# Patient Record
Sex: Male | Born: 1950 | Race: White | Hispanic: No
Health system: Midwestern US, Community
[De-identification: ages and names within clinical notes are randomized; demographics above are authoritative.]

---

## 2018-04-04 IMAGING — CR XR CHEST 2 VIEWS
1 series · 3 of 3 positions shown · non-contrast
Comparison: none

cough
TWO VIEW CHEST:
CLINICAL INDICATION: Cough
REFERENCE: 06/08/2016

[Series 6288: PA · 0.19mm/px · 3 of 3 slices shown]
[im 1/3]
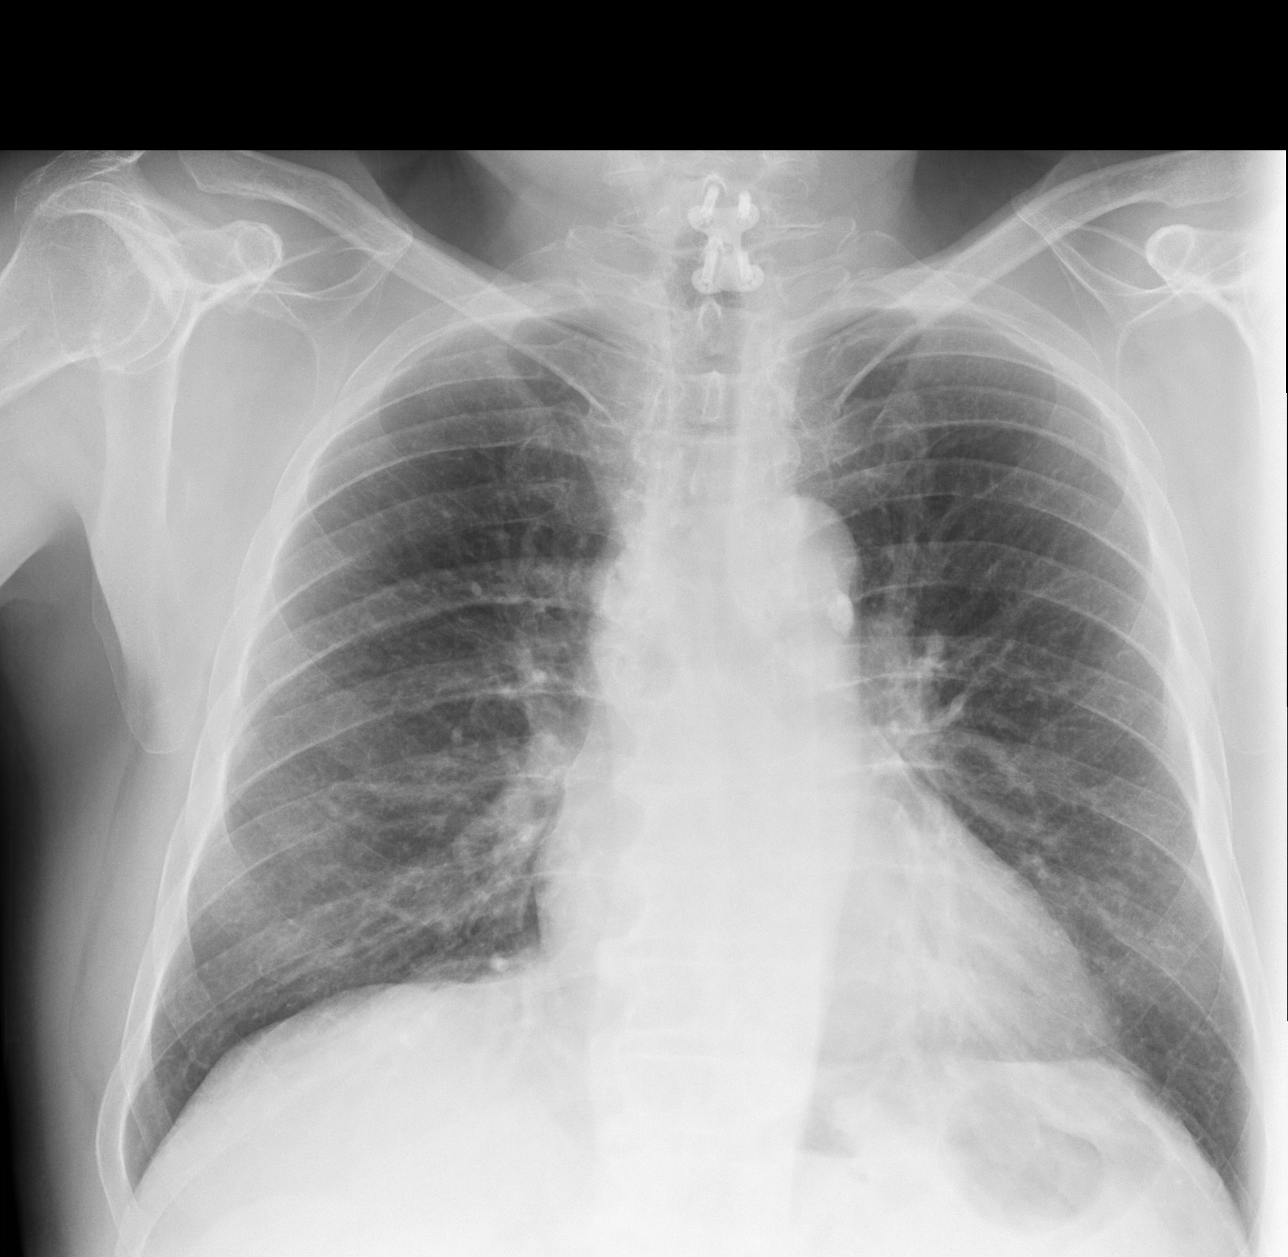
[im 2/3]
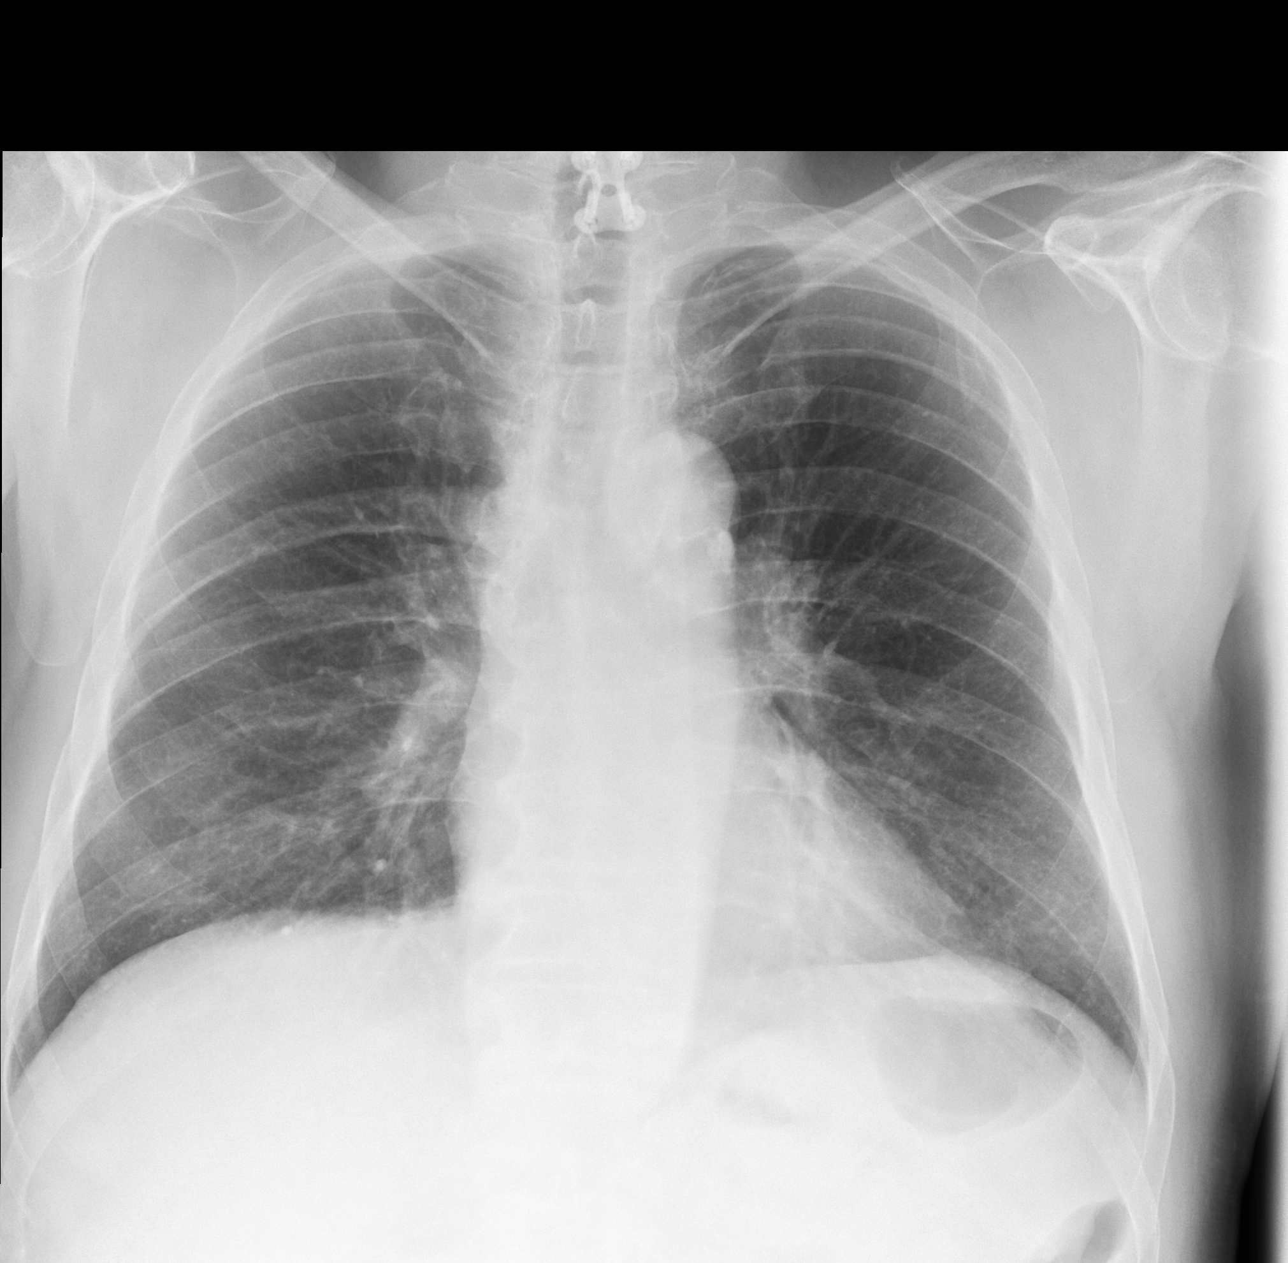
[im 3/3]
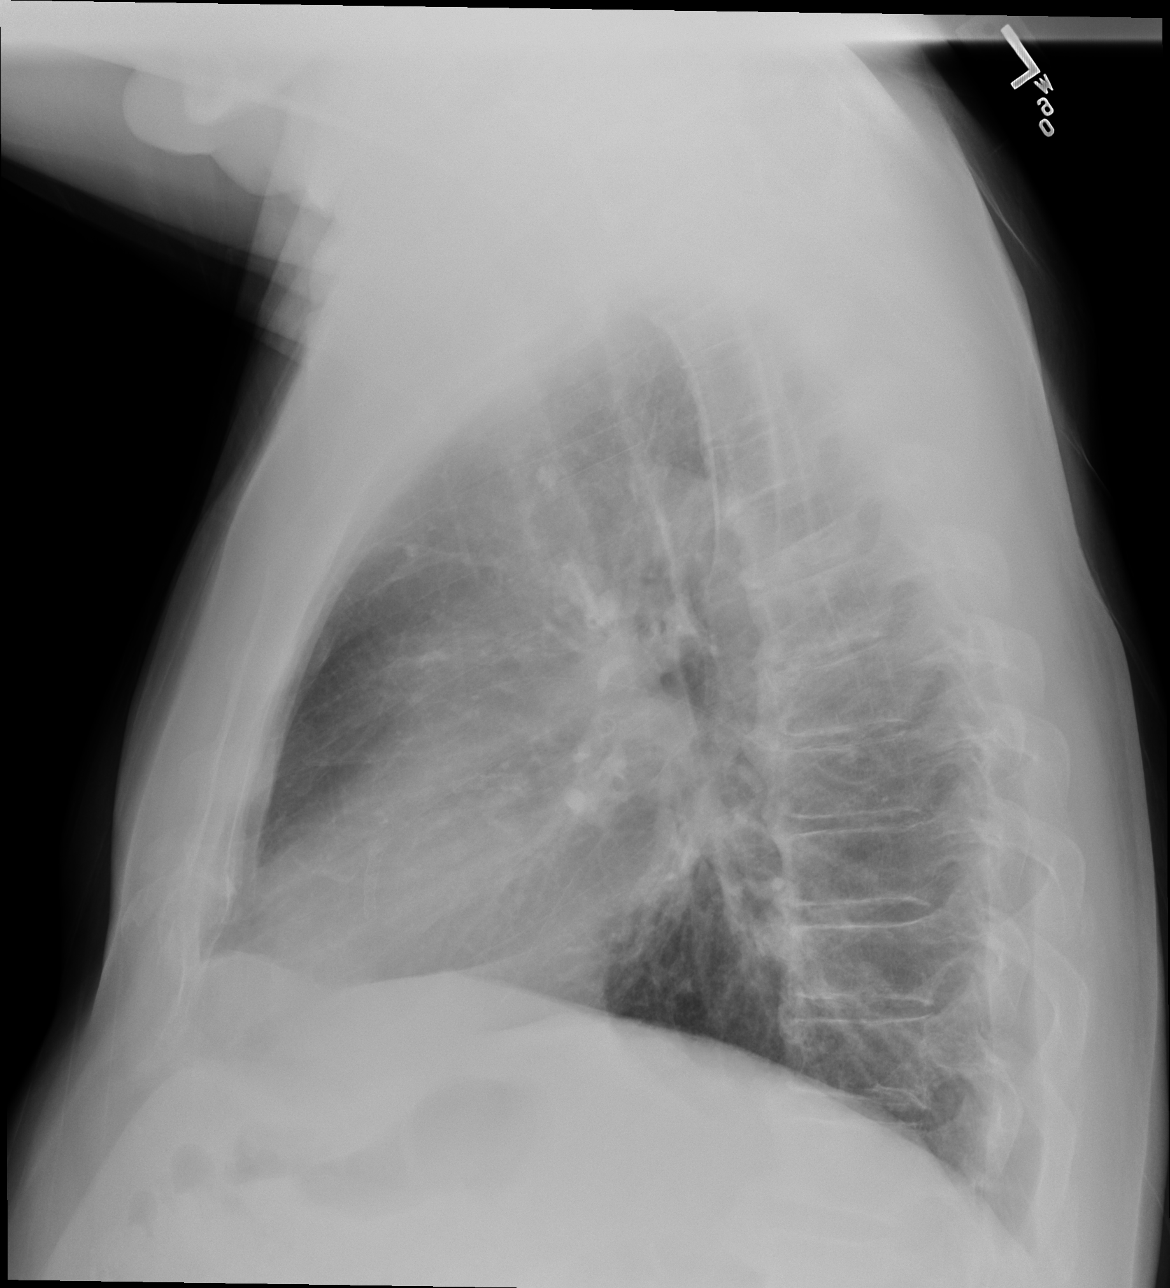

[3 of 3 positions shown; findings below may reference images not displayed]

FINDINGS: PA and lateral radiographs of the chest demonstrate:
Normal cardiomediastinal contours.
Heart size appears normal.
The lungs are normally inflated and demonstrate no mass, infiltrate, or effusion.
Surrounding osseous and soft tissue structures are unchanged.
IMPRESSION: No acute cardiopulmonary process identified.
LOCATION CODE: 1

## 2019-01-31 IMAGING — CT CT ABDOMEN PELVIS W CONTRAST
3 of 7 series · 12 of 46 positions shown, 19 images · IV contrast (CONTRAST)
Comparison: none

CT abdomen pelvis with contrast
INDICATION: Fall with back pain
TECHNIQUE: Axial coronal and sagittal images were performed

[Series 2: abdomen with · axial · 0.87mm/px · z∈[-400,-20]mm · 8 of 165 slices shown, 13 images]
[im 19/165  soft-tissue]
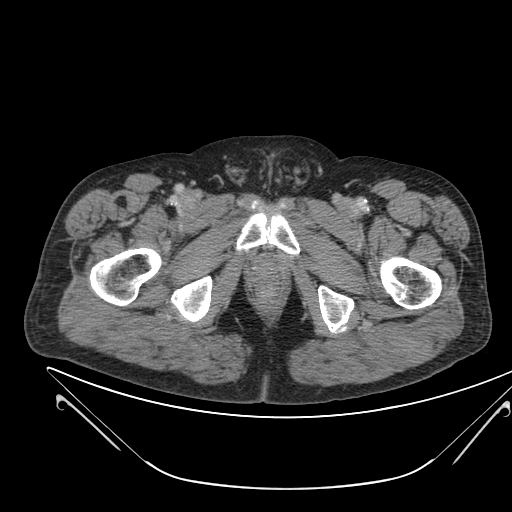
[im 19/165  bone]
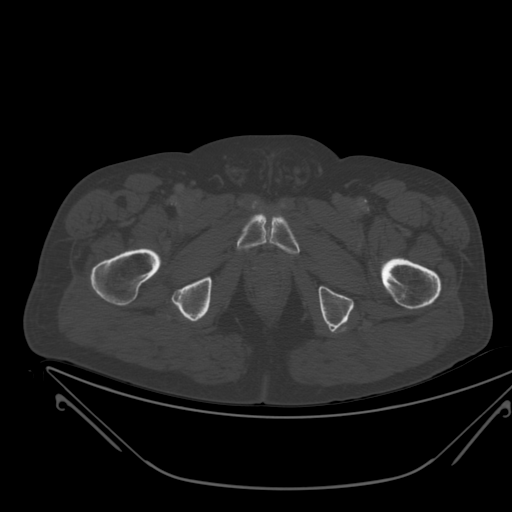
[im 37/165  soft-tissue]
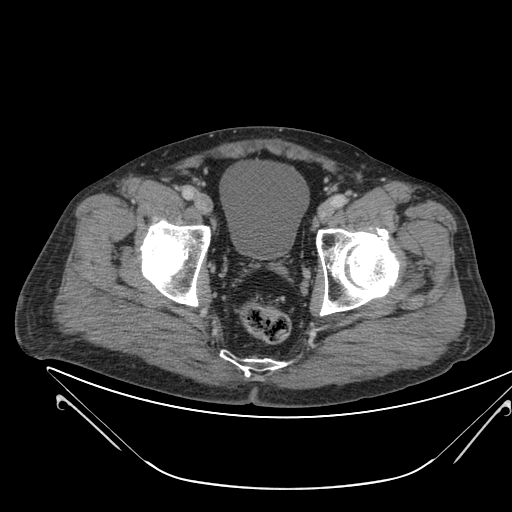
[im 55/165  soft-tissue]
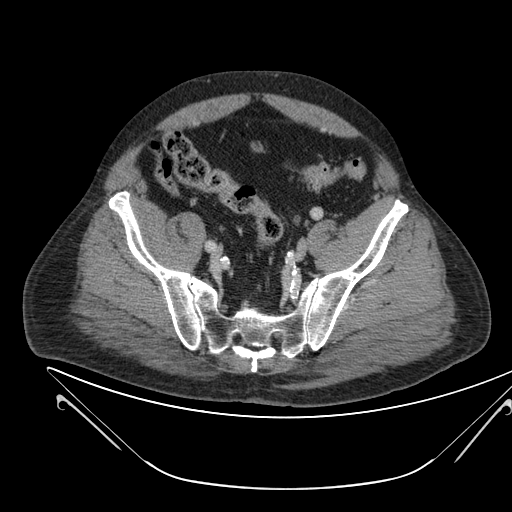
[im 73/165  soft-tissue]
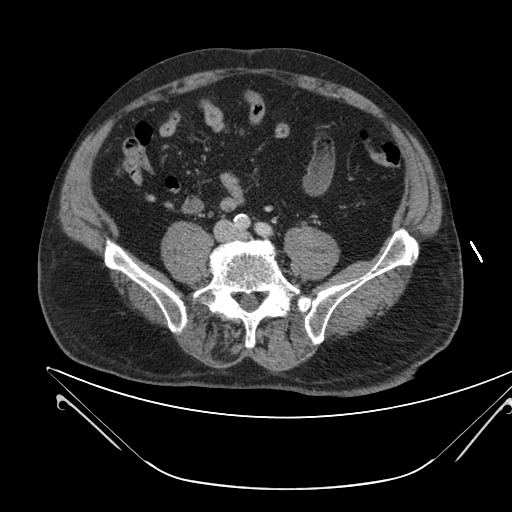
[im 92/165  soft-tissue]
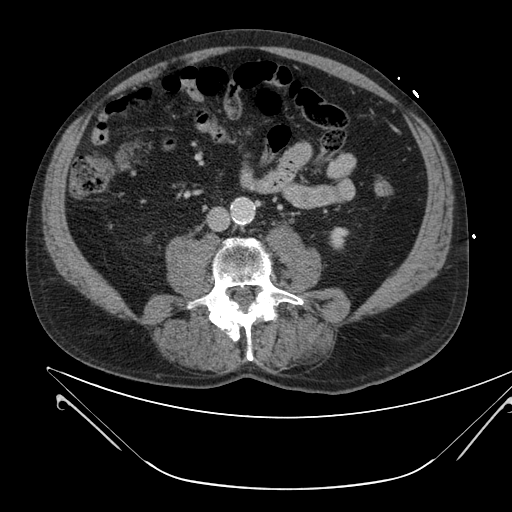
[im 92/165  lung]
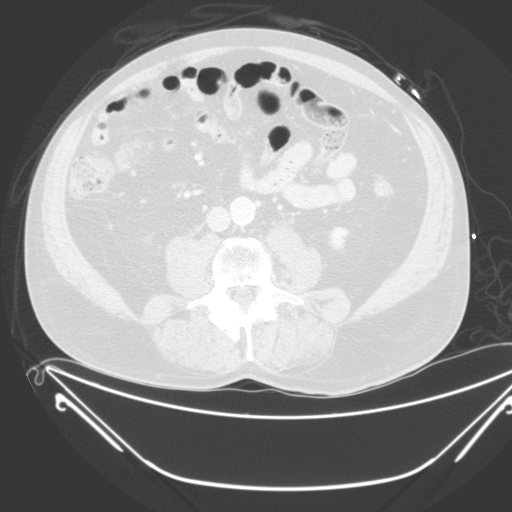
[im 110/165  soft-tissue]
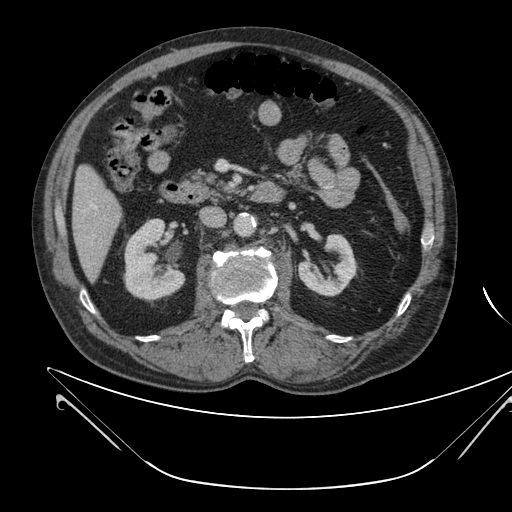
[im 110/165  lung]
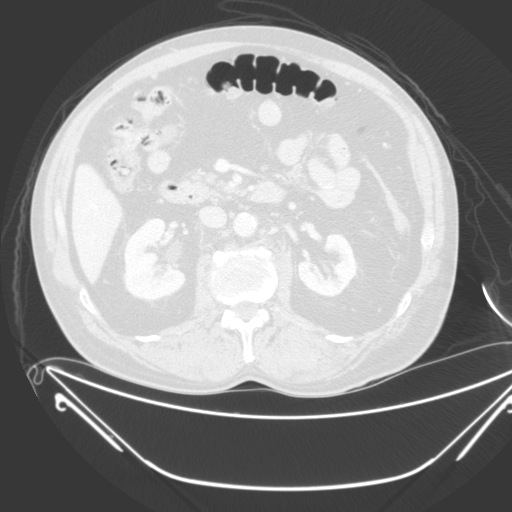
[im 128/165  soft-tissue]
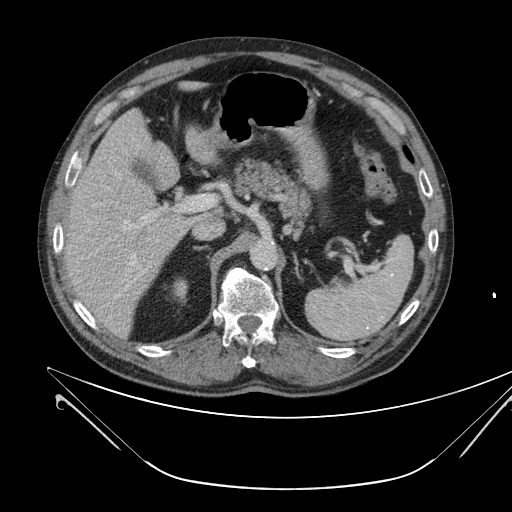
[im 128/165  lung]
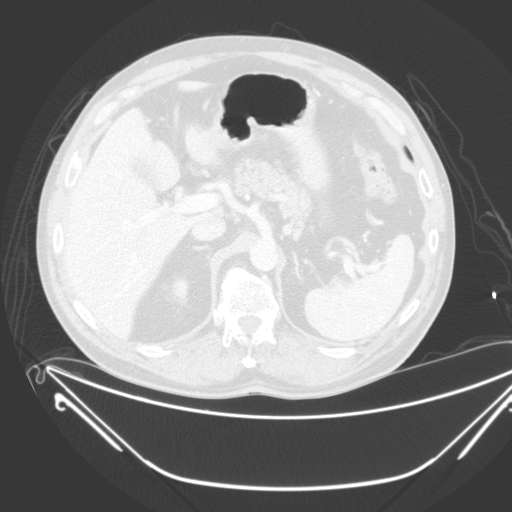
[im 146/165  soft-tissue]
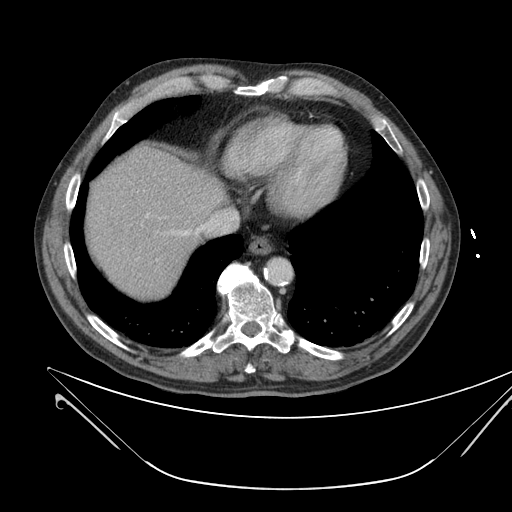
[im 146/165  lung]
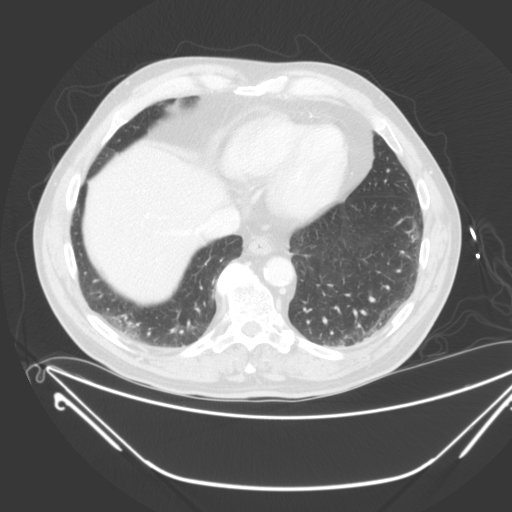

[mpr, coronal, coronal · coronal · 0.96mm/px · 3 of 157 slices shown, 4 images]
[im 53/157  soft-tissue]
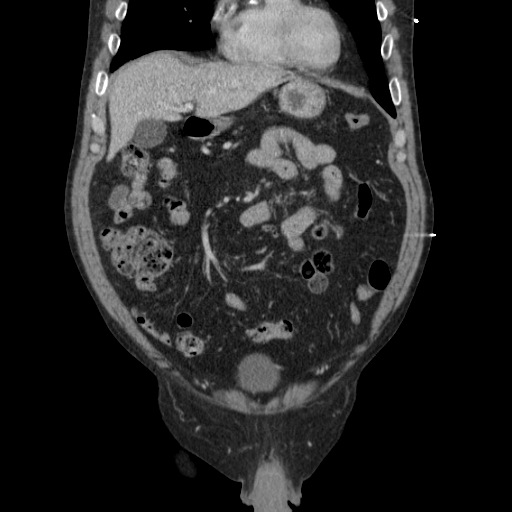
[im 79/157  soft-tissue]
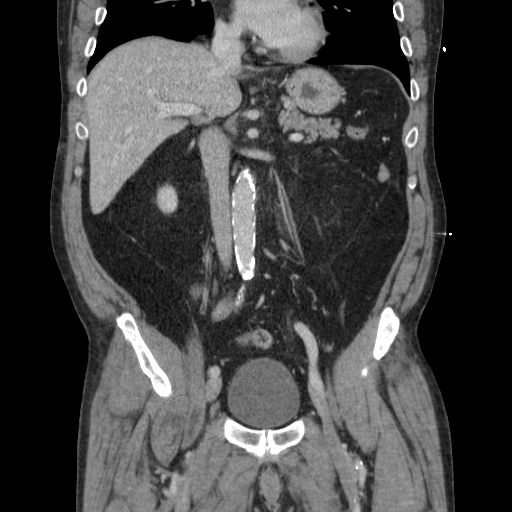
[im 79/157  bone]
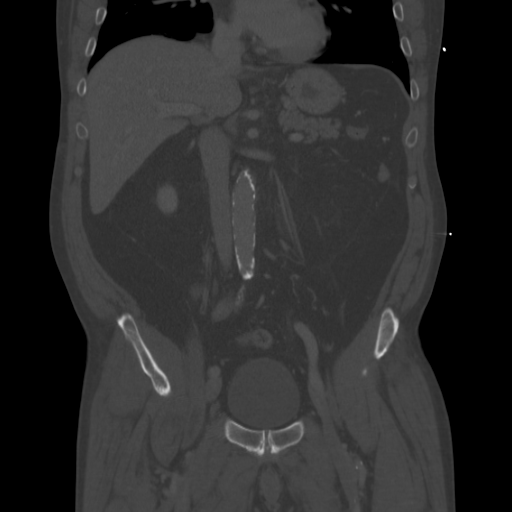
[im 105/157  soft-tissue]
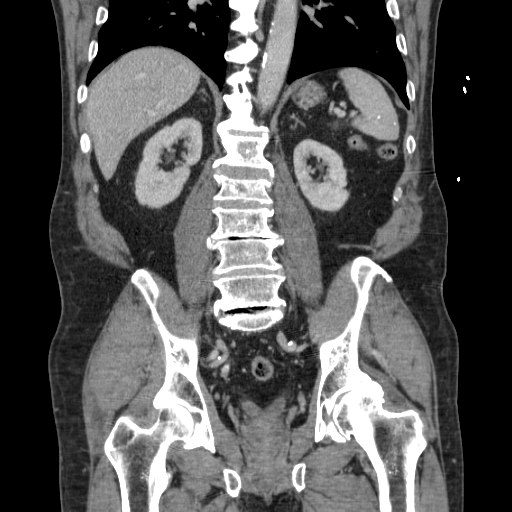

[mpr, sagittal, sagittal · sagittal · 0.96mm/px · 1 of 186 slices shown, 2 images]
[im 93/186  soft-tissue]
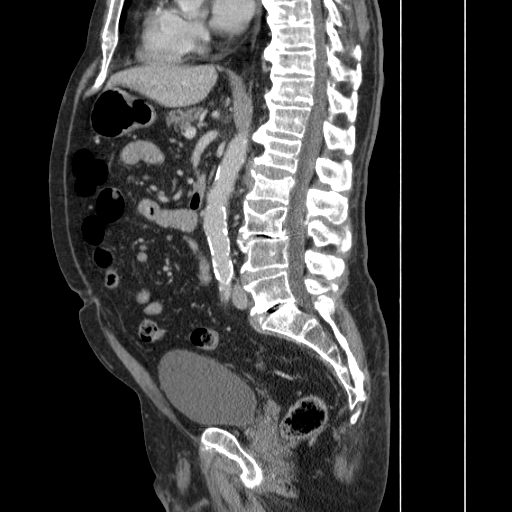
[im 93/186  bone]
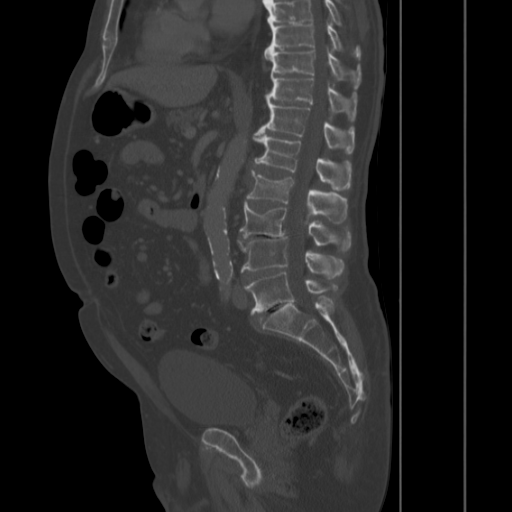

[12 of 46 positions shown; findings below may reference images not displayed]

FINDINGS: Mild atelectasis in the right lower lung. Mild fatty infiltration the liver. Adrenal glands, pancreas, gallbladder and spleen appear normal. Appendix appears normal. No free fluid is seen in the abdomen or pelvis.
There is acute fracture of the superior endplate of the L2 vertebral body. No severe retropulsion is seen surrounding inflammation anteriorly degenerative change in bilateral hips. Mild irregularities. Endplate of L3 vertebral body. Mild anterior listhesis of L3 on L4. Bilateral pars defects at L3.
IMPRESSION: 1. Acute compression fractures of the superior endplate and anterior vertebral body of the L2 vertebral body. Surrounding inflammation anteriorly.
2. Bilateral pars defects at L4 3. Mild anterior listhesis of L3 on L4. Mild irregularities. Endplate may be chronic.
3. Degenerative changes seen throughout the spine and bilateral hips.
4. No acute intra-abdominal findings otherwise seen.

## 2019-01-31 IMAGING — DX XR CHEST 1 VIEW
1 series · 1 of 1 positions shown · non-contrast
Comparison: none

Portable chest
INDICATION: Chest pain

[AP]
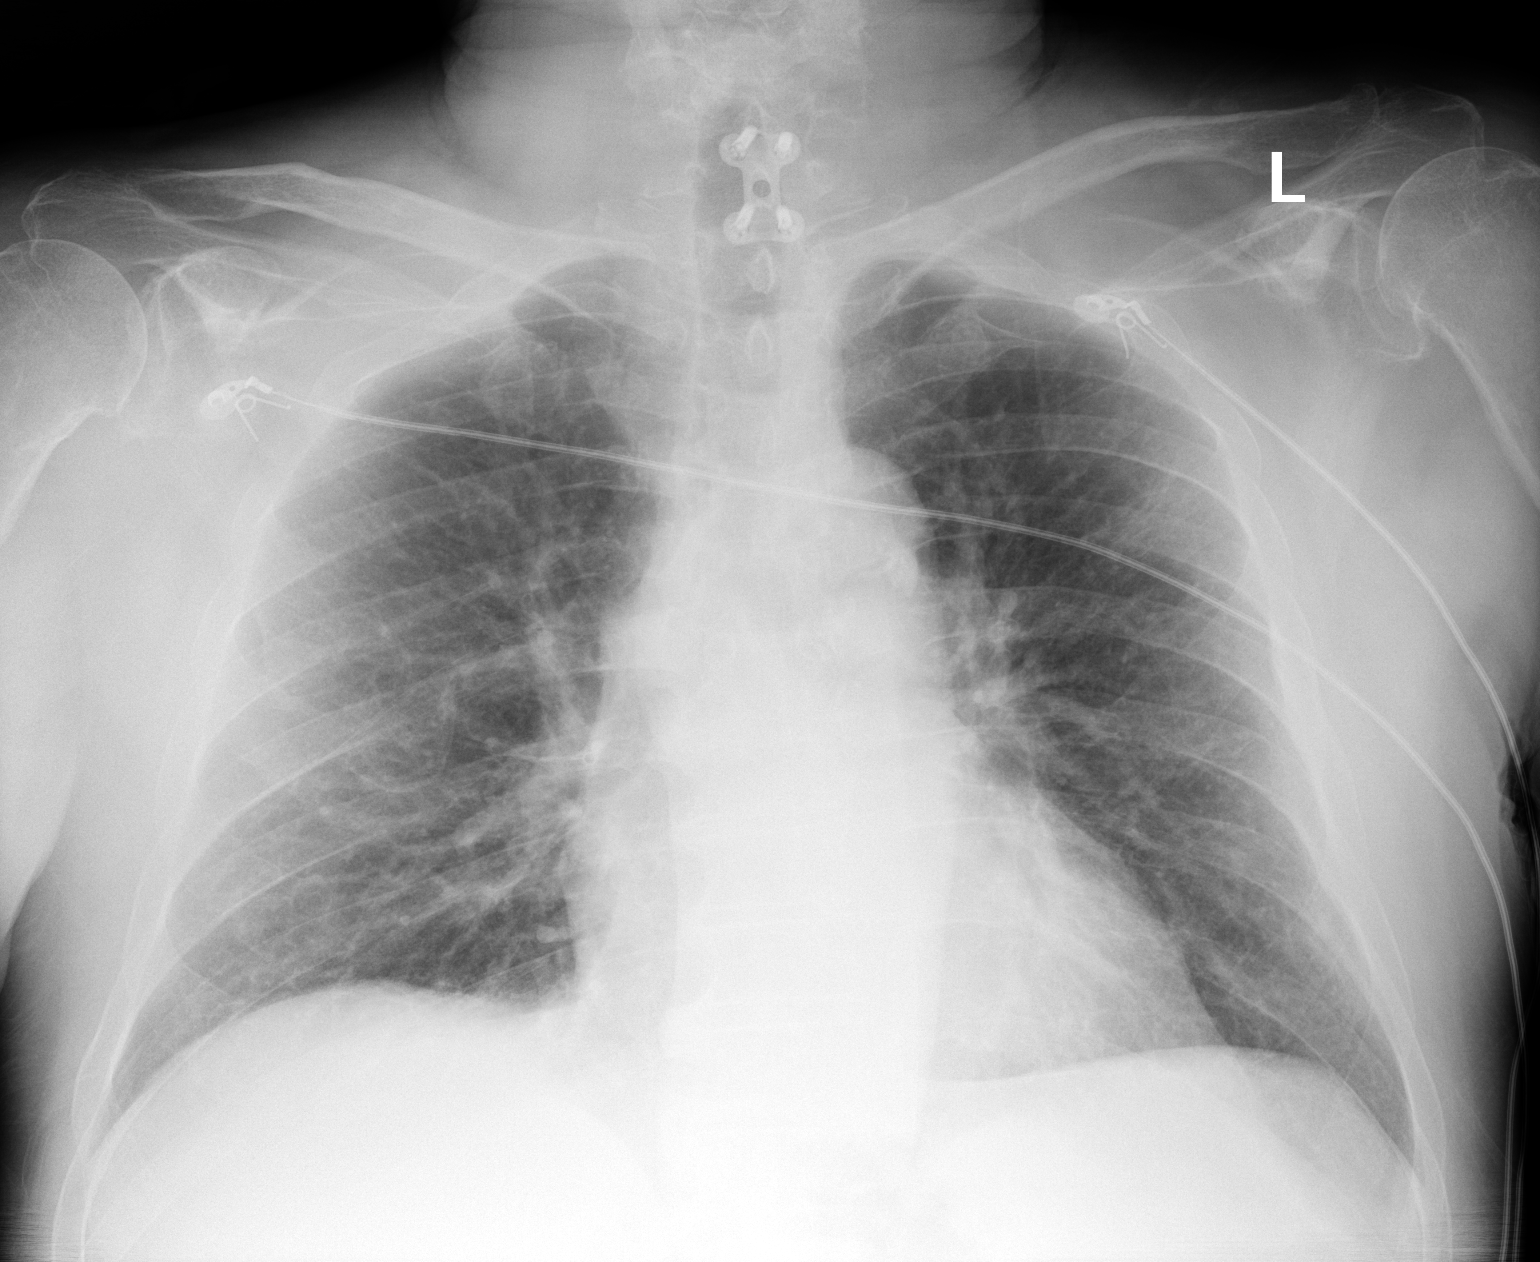

[1 of 1 positions shown; findings below may reference images not displayed]

FINDINGS: No focal consolidation, pleural effusion or pneumothorax. Heart size appears normal.
IMPRESSION: No acute findings.

## 2019-06-01 IMAGING — CR Chest
3 series · 3 of 3 positions shown · non-contrast
Comparison: none

TWO VIEW CHEST:
CLINICAL INDICATION: night sweats, h/o smoking. eval for lung lesions
REFERENCE: [id number].

[pa (1 of 2)]
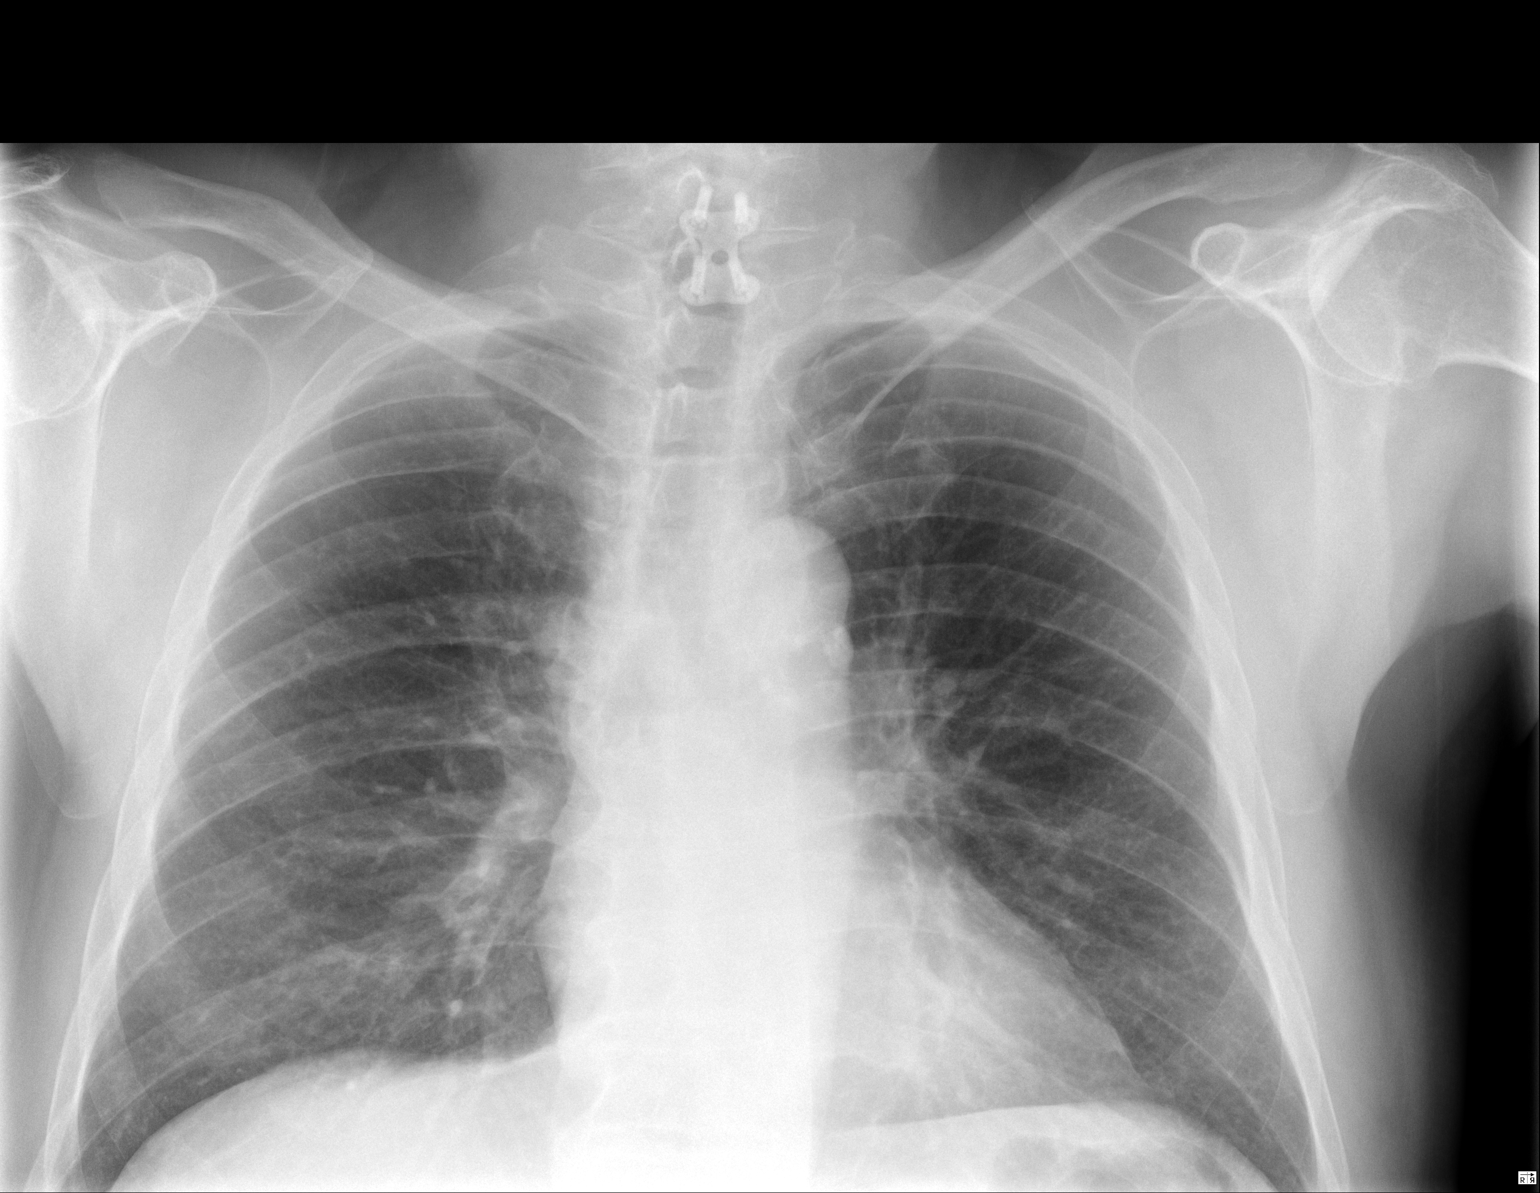

[pa (2 of 2)]
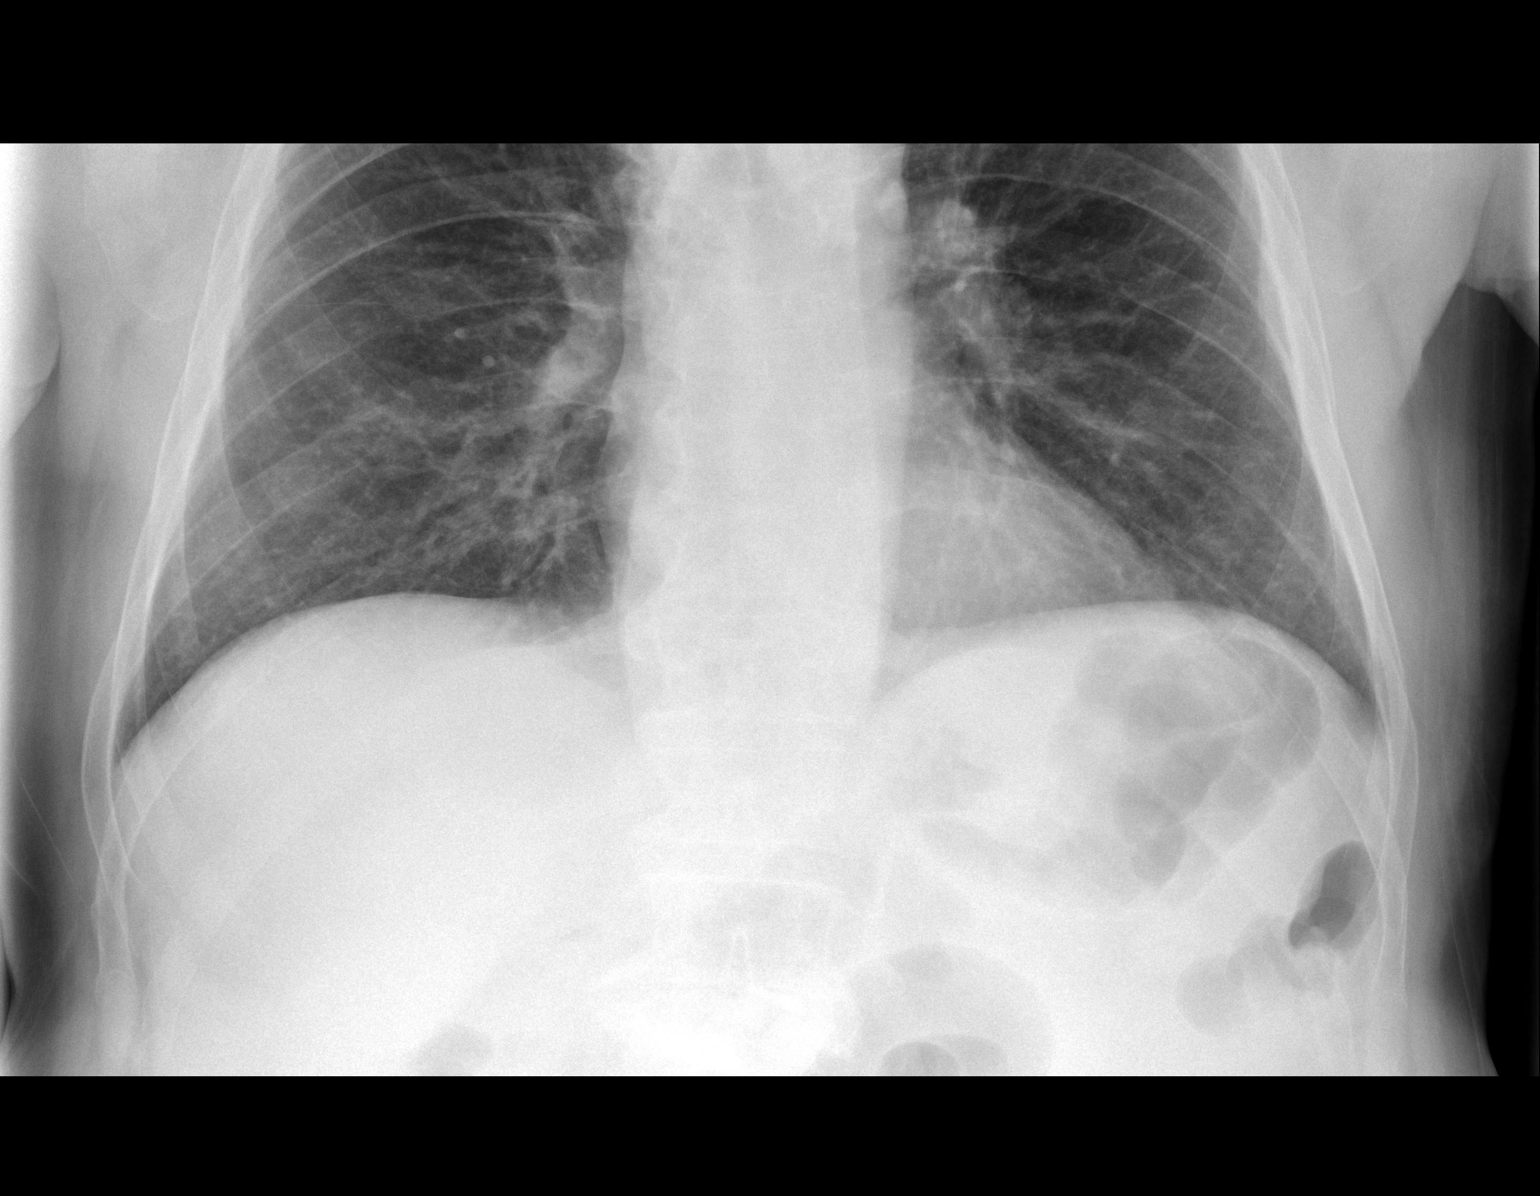

[lateral l]
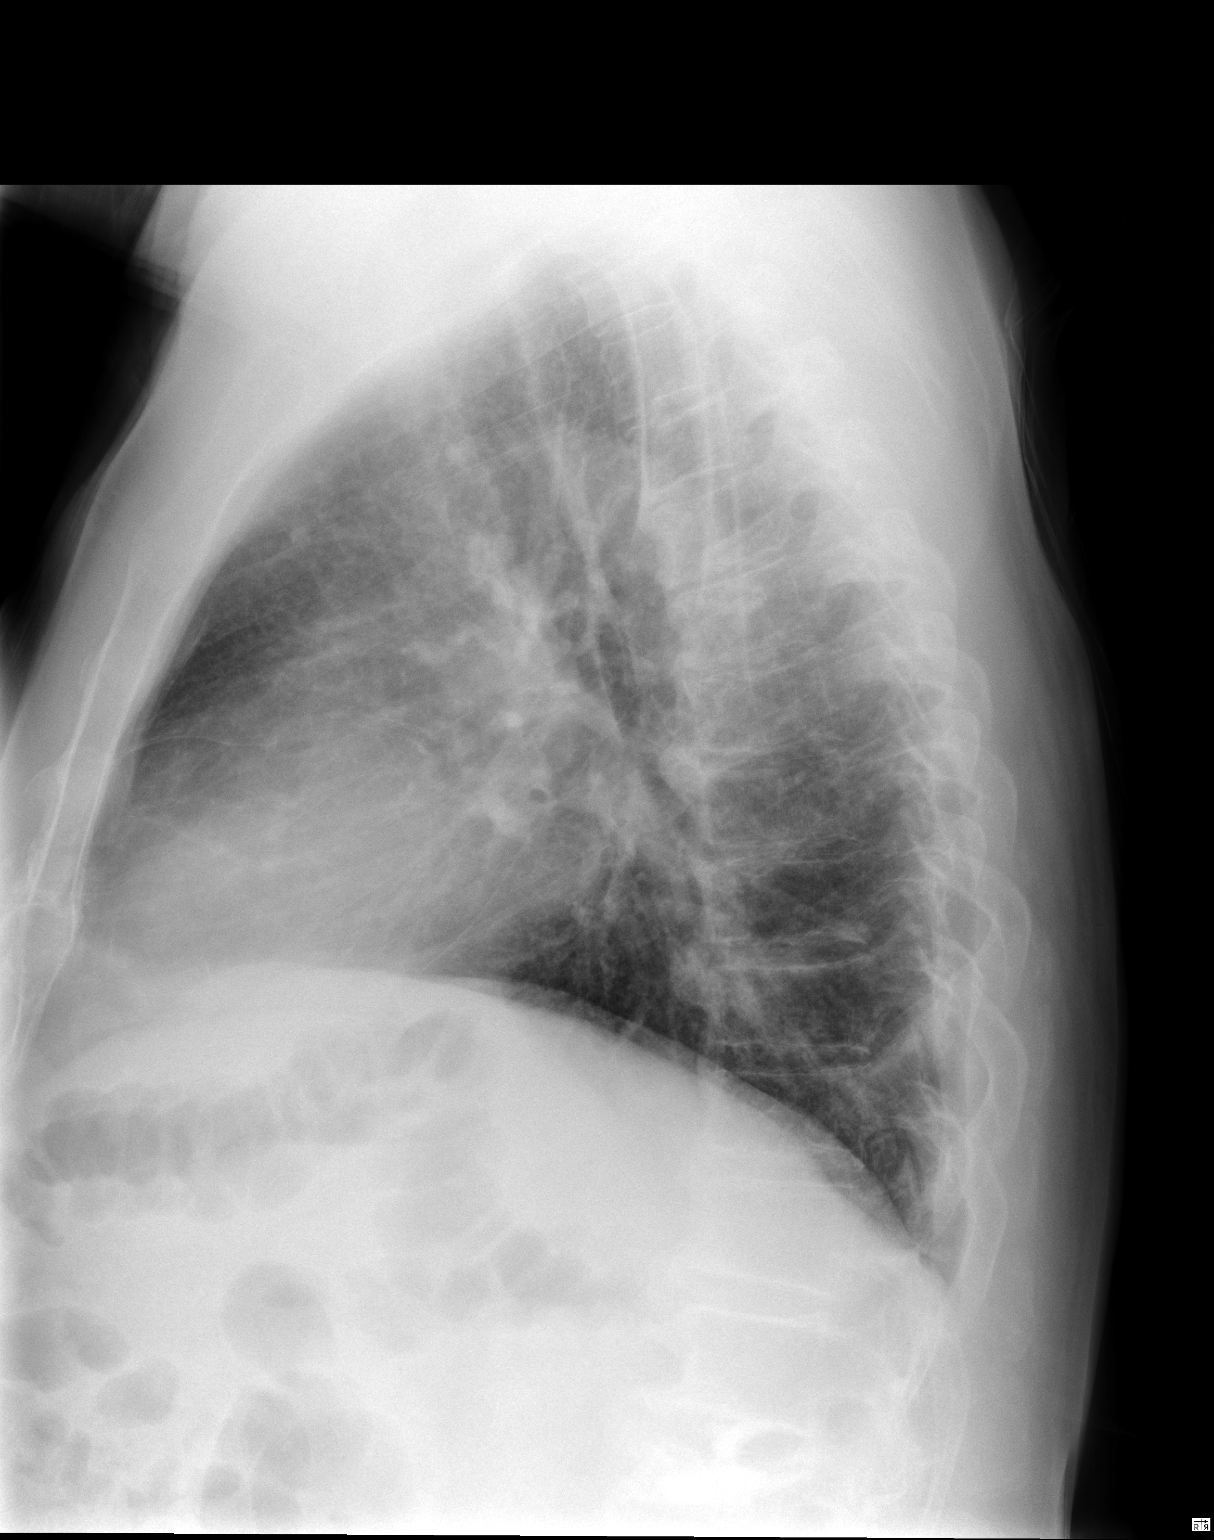

[3 of 3 positions shown; findings below may reference images not displayed]

FINDINGS: PA and lateral radiographs of the chest demonstrate:
Normal cardiomediastinal contours.
The lungs are normally inflated and demonstrate no mass, infiltrate, or effusion.
Surrounding osseous and soft tissue structures demonstrate no acute abnormality.
IMPRESSION: No acute cardiopulmonary process identified.
LOCATION CODE: 1

## 2020-02-19 IMAGING — US US UP EXT ARTERIES LEFT
1 series · 14 of 16 positions shown · non-contrast
Comparison: none

EXAM: LEFT UPPER EXTREMITY ARTERIAL DOPPLER
CLINICAL INDICATION: Essential (primary) hypertension
Pain in left arm
TECHNIQUE: Grayscale and color Doppler imaging with spectral analysis was utilized to evaluate the major arterial structures of the left upper extremity.

[Series 1: us up ext arteries left · 14 of 28 slices shown]
[im 1/28]
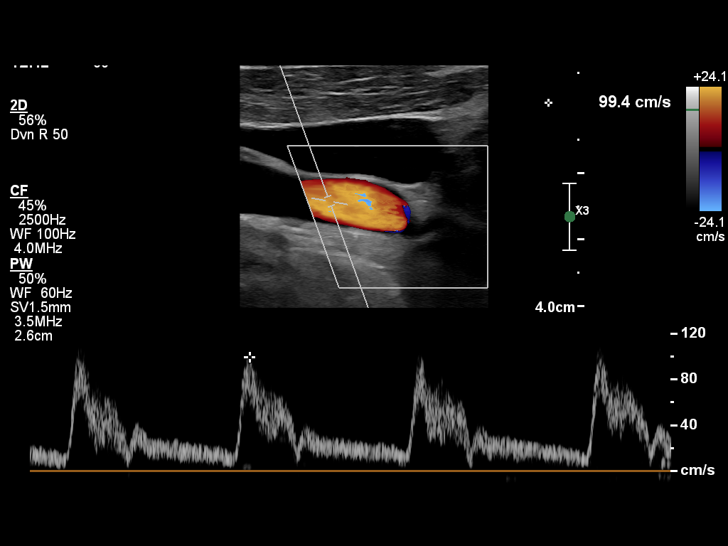
[im 2/28]
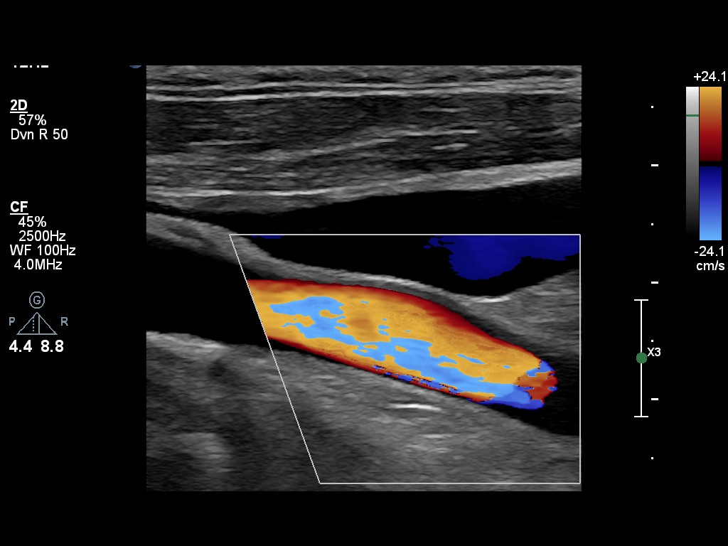
[im 4/28]
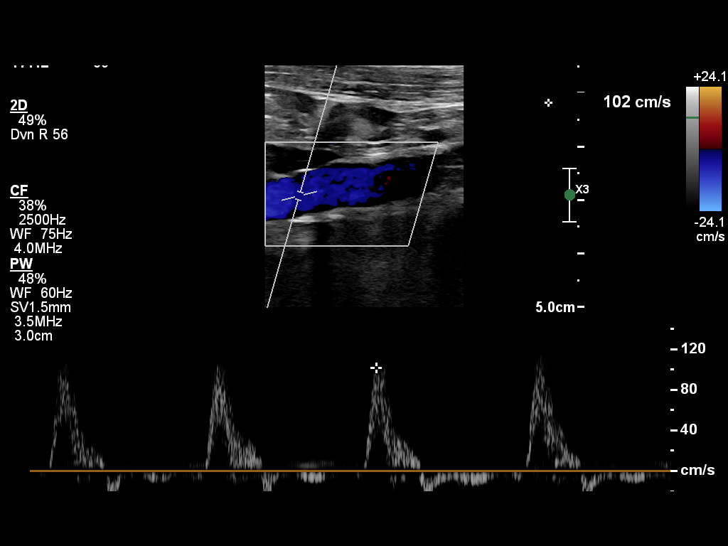
[im 8/28]
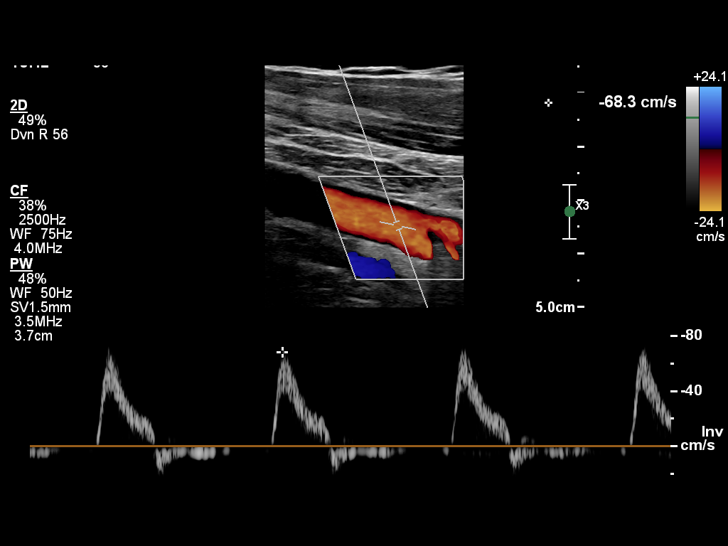
[im 10/28]
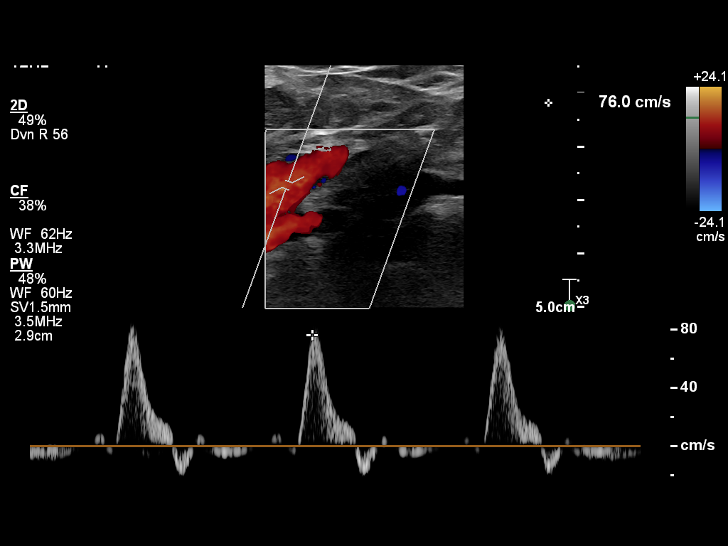
[im 11/28]
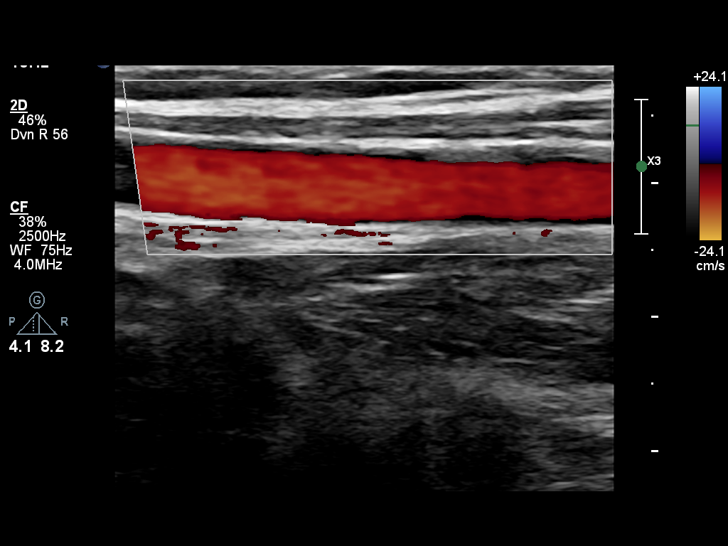
[im 13/28]
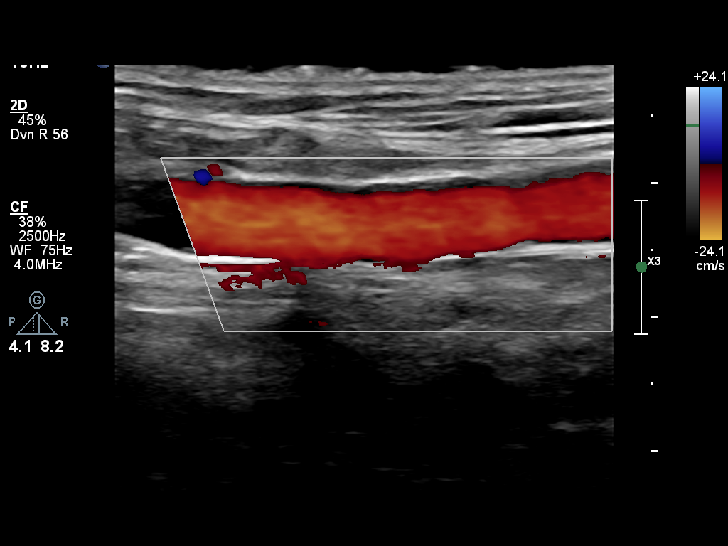
[im 15/28]
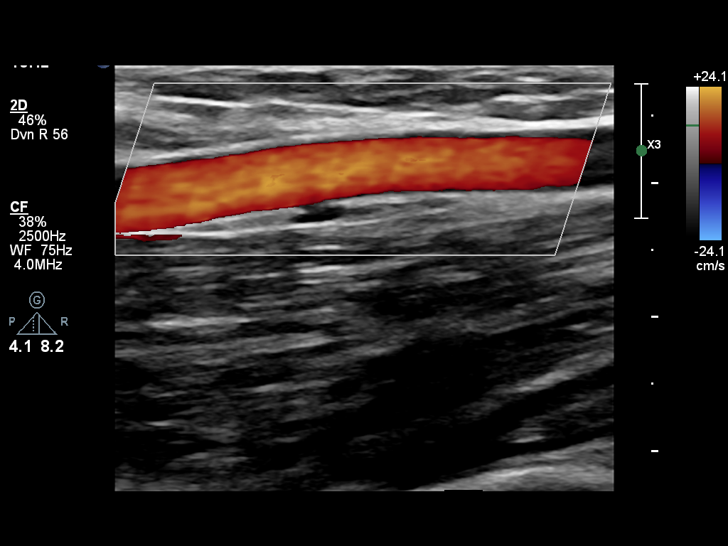
[im 17/28]
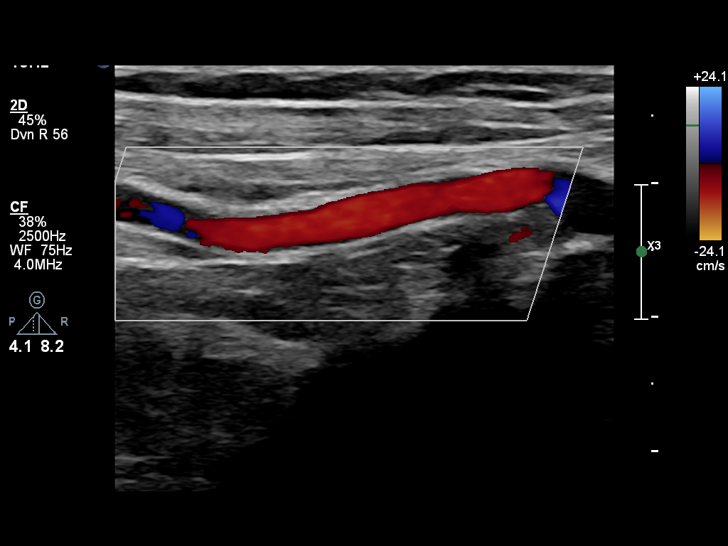
[im 19/28]
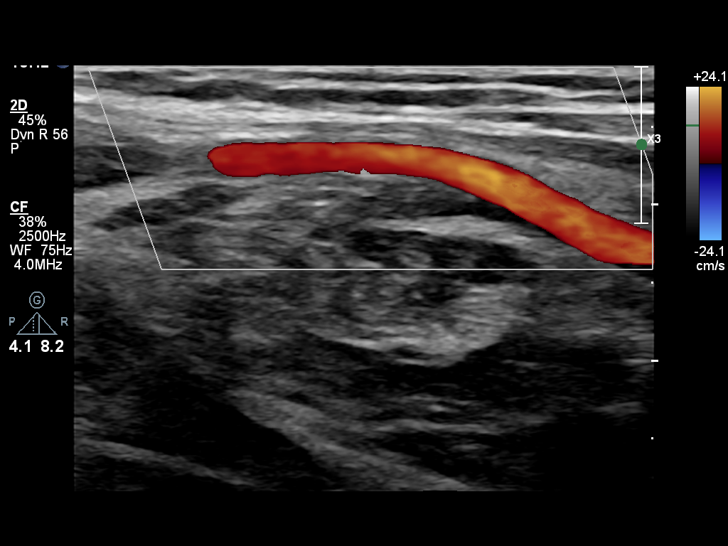
[im 22/28]
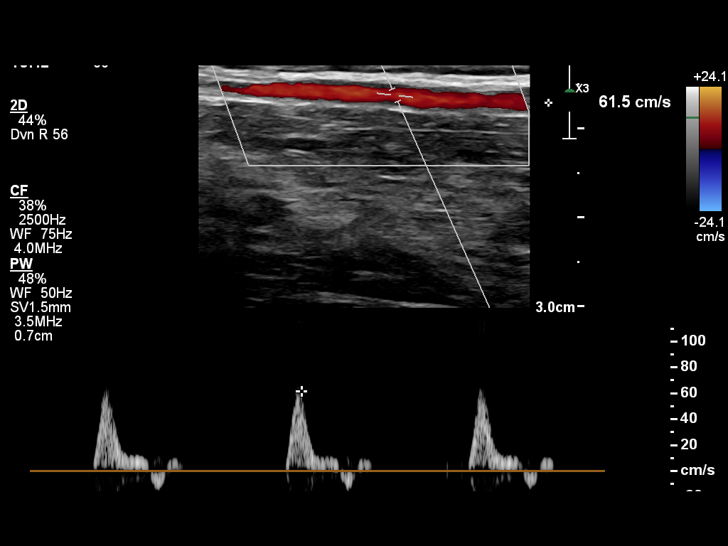
[im 24/28]
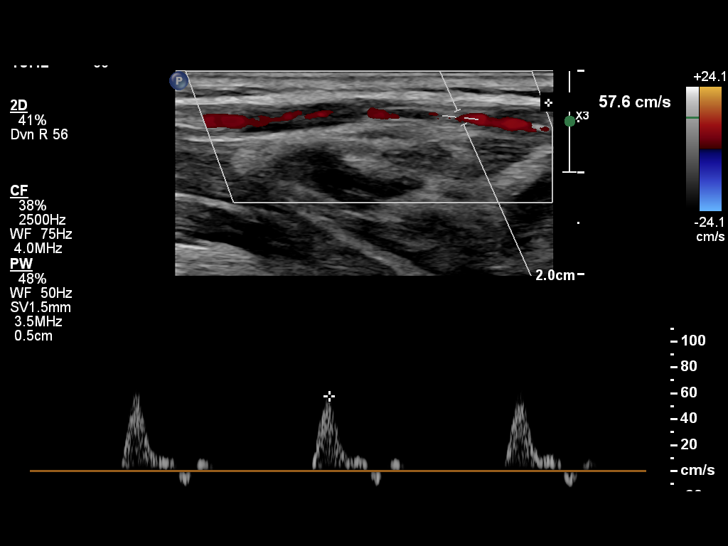
[im 26/28]
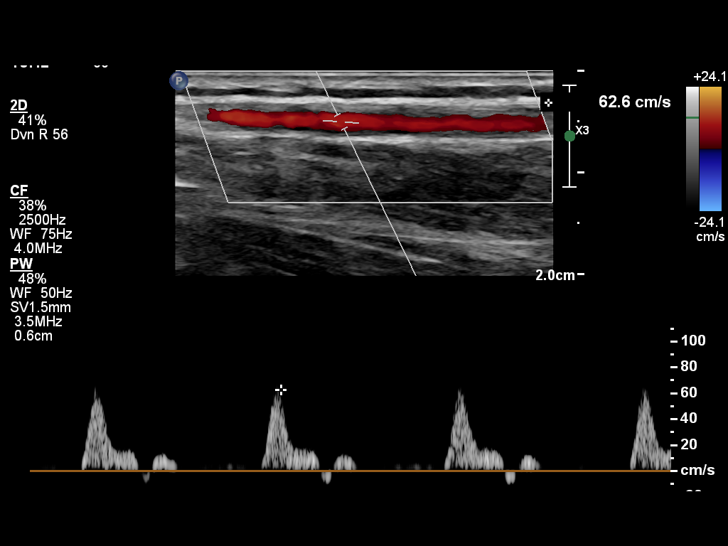
[im 28/28]
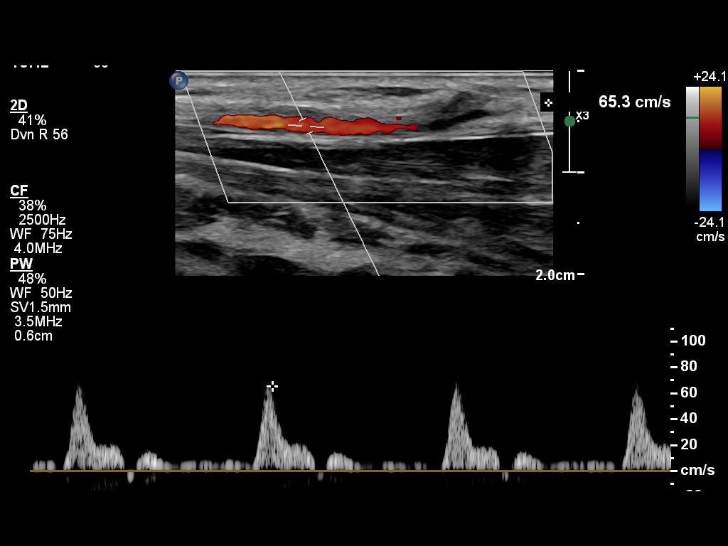

[14 of 16 positions shown; findings below may reference images not displayed]

FINDINGS: The common carotid artery, subclavian artery, axillary artery, brachial artery, radial artery, and ulnar arteries are patent and demonstrate no significant atherosclerotic change, flow limiting stenosis, or other abnormality. Waveforms are biphasic and triphasic throughout, normal.
FINDINGS: LEFT UPPER EXTREMITY:
PROXIMAL SUBCLAVIAN A:    102 cm/sec
MID SUBCLAVIAN A:    61 cm/sec
DISTAL SUBCLAVIAN A:    68 cm/sec
AXILLARY A:    76 cm/sec
PROXIMAL BRACHIAL A:    91 cm/sec
MID BRACHIAL A:    78 cm/sec
DISTAL BRACHIAL A:    68 cm/sec
PROXIMAL RADIAL A:    68 cm/sec
MID RADIAL A:    85 cm/sec
DISTAL RADIAL A:    62 cm/sec
PROXIMAL ULNAR A:    58 cm/sec
MID ULNAR A:    63 cm/sec
DISTAL ULNAR A:    65 cm/sec
IMPRESSION: No evidence of significant arterial stenosis or occlusion
LOCATION CODE : 1

## 2020-09-20 IMAGING — MR MRI ELBOW LEFT WITHOUT IV CONTRAST
8 series · 40 of 40 positions shown · non-contrast
Comparison: none

FINAL REPORT:
MRI ELBOW LEFT WITHOUT IV CONTRAST
INDICATION: Bicipital tendinitis, left shoulder
Bicipital tendinitis, left shoulder
TECHNIQUE: MRI left elbow without contrast. Multiplanar, multisequence imaging was performed and reviewed.

[Series 7: survey · axial · left · 6.0mm · 0.47mm/px · 1 of 9 slices shown (1 of 2)]
[im 1/9]
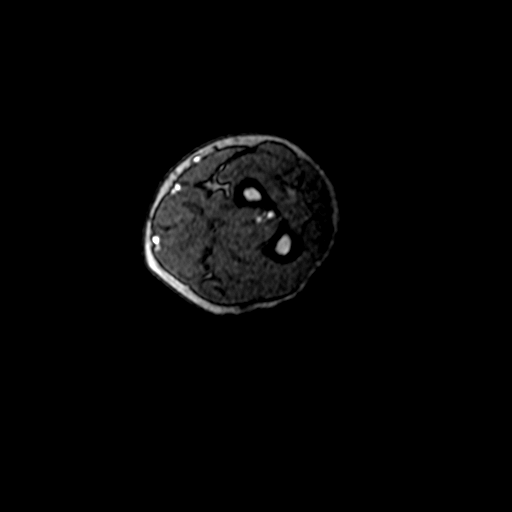

[Series 8: survey · axial · left · 6.0mm · 0.47mm/px · 1 of 9 slices shown (2 of 2)]
[im 1/9]
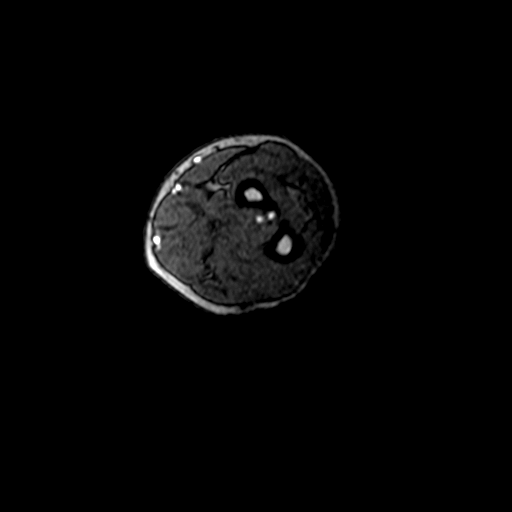

[Series 9: T1 · axial · left · 3.0mm · 0.50mm/px · z∈[-29,+82]mm · 5 of 32 slices shown (1 of 2)]
[im 1/32]
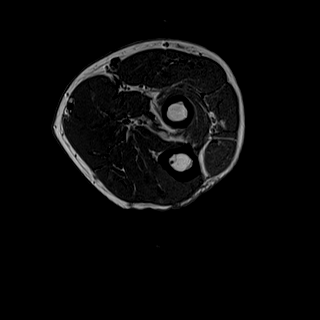
[im 8/32]
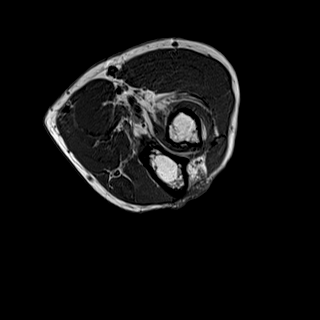
[im 16/32]
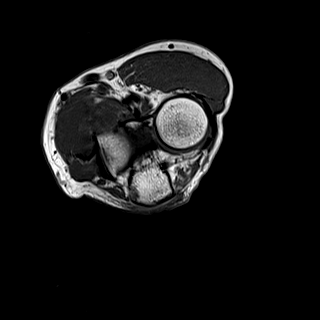
[im 24/32]
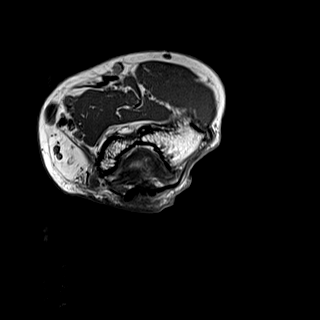
[im 32/32]
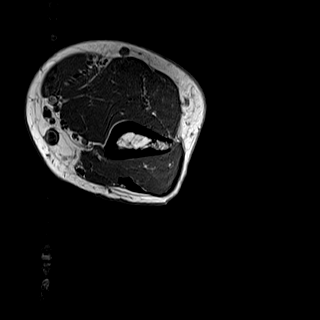

[Series 10: PD fat-sat · axial · left · 3.0mm · 0.50mm/px · z∈[-26,+85]mm · 5 of 32 slices shown (1 of 2)]
[im 1/32]
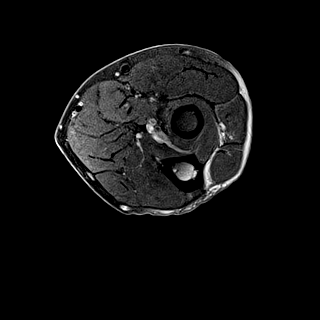
[im 8/32]
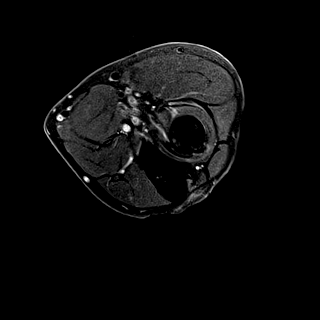
[im 16/32]
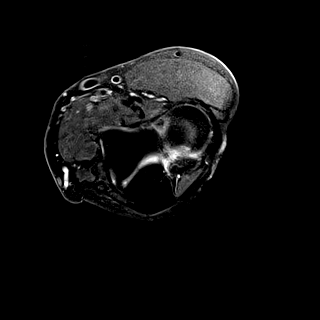
[im 24/32]
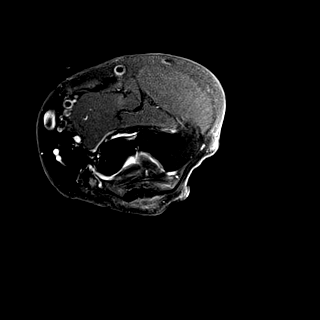
[im 32/32]
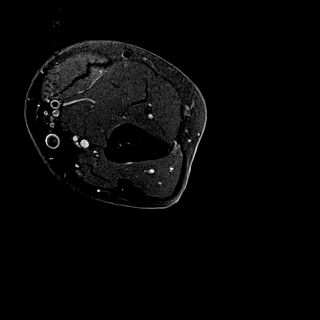

[Series 11: T1 · coronal · left · 3.0mm · 0.53mm/px · 5 of 30 slices shown (2 of 2)]
[im 1/30]
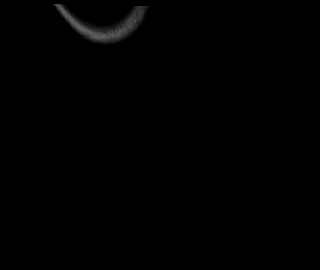
[im 8/30]
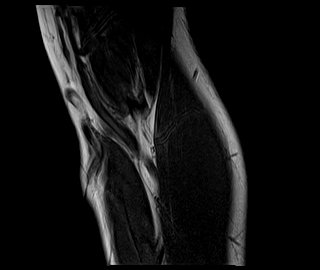
[im 15/30]
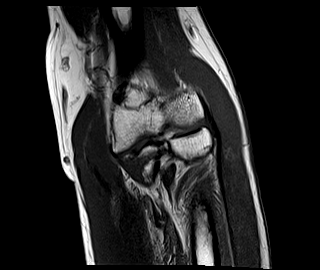
[im 22/30]
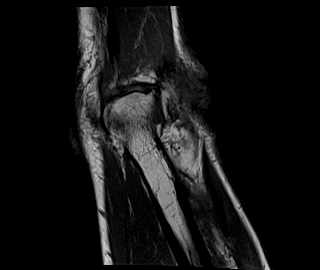
[im 30/30]
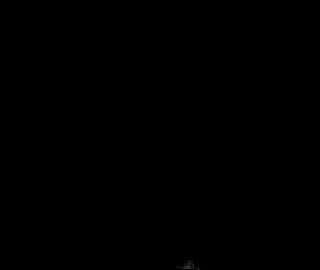

[Series 14: (person_name) · coronal · left · 1.0mm · 0.70mm/px · 13 of 80 slices shown]
[im 1/80]
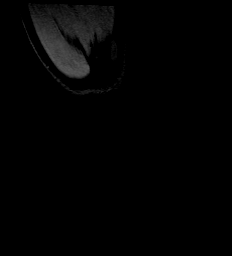
[im 7/80]
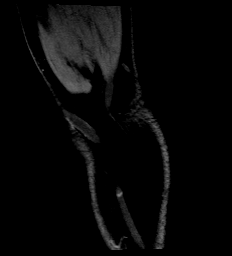
[im 14/80]
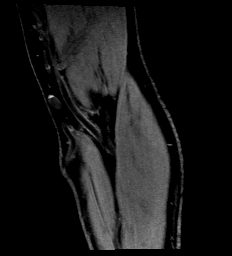
[im 20/80]
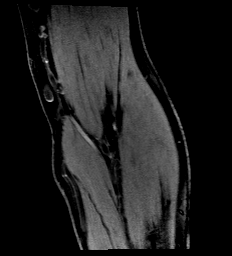
[im 27/80]
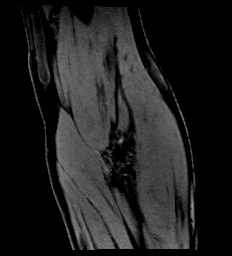
[im 33/80]
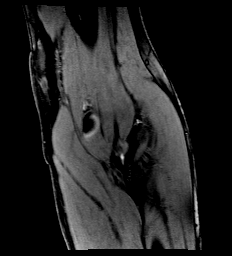
[im 40/80]
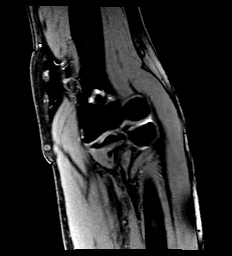
[im 47/80]
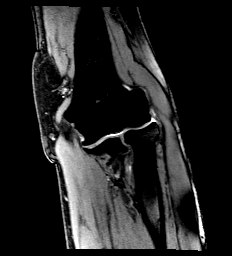
[im 53/80]
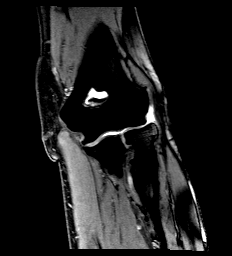
[im 60/80]
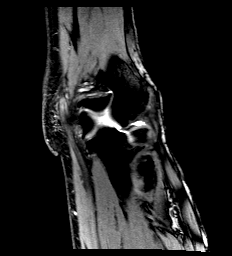
[im 66/80]
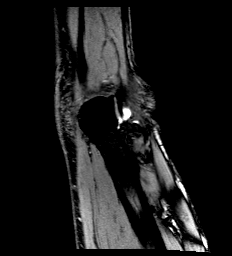
[im 73/80]
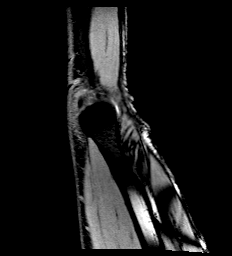
[im 80/80]
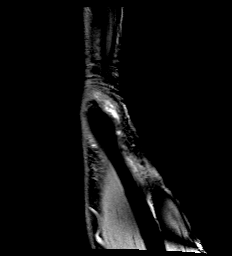

[Series 15: T2 fat-sat · coronal · left · 3.0mm · 0.56mm/px · 5 of 29 slices shown]
[im 1/29]
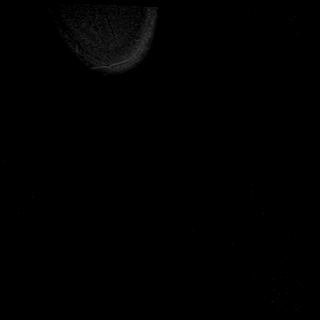
[im 8/29]
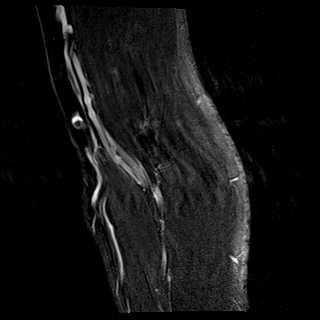
[im 15/29]
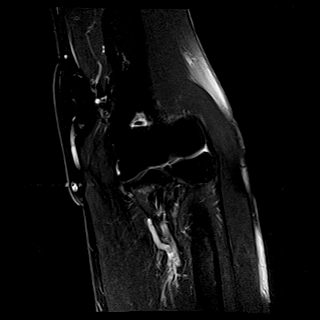
[im 22/29]
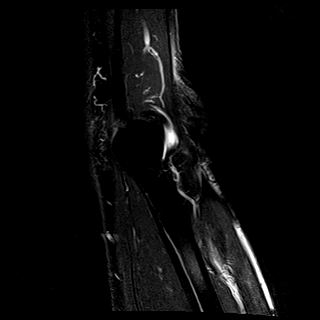
[im 29/29]
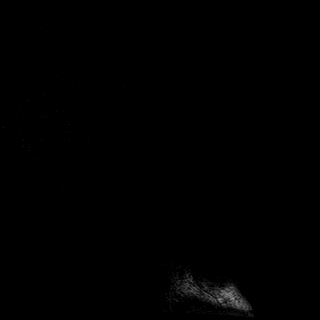

[Series 17: PD fat-sat · sagittal · left · 3.0mm · 0.50mm/px · 5 of 34 slices shown (2 of 2)]
[im 1/34]
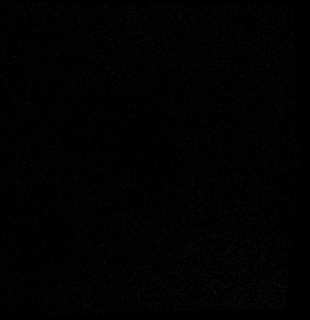
[im 9/34]
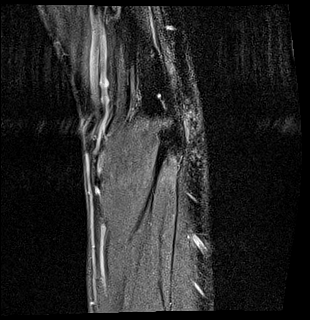
[im 17/34]
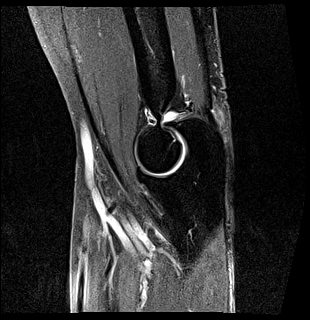
[im 25/34]
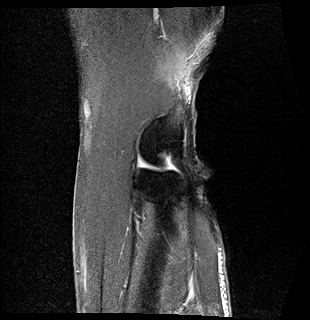
[im 34/34]
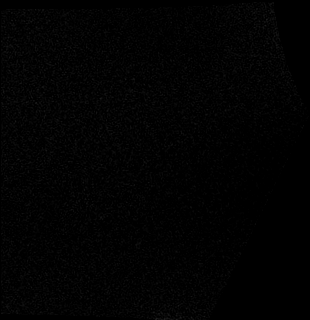

[40 of 40 positions shown; findings below may reference images not displayed]

FINDINGS: Osseous alignment is grossly anatomic. Mild to moderate cartilage thinning with no focal high-grade or full-thickness chondral defects. No joint effusion or evidence of joint spaces bodies. Mild marginal osteophyte formation. Collateral ligaments are grossly intact. The common flexor and extensor tendon origins are intact. The triceps tendon is intact and appears unremarkable. There is a mild increased signal and mild attenuation of the distal biceps tendon the level of the radial tuberosity. No evidence of high-grade or complete tendon tear.
IMPRESSION: 
IMPRESSION: 1. Biceps tendinosis/low-grade chronic tear. No evidence of high-grade or complete tendon rupture.
2. Degenerative changes as described.
Location 4

## 2021-06-13 IMAGING — MR MRI SHOULDER ARTHROGRAM LEFT
16 series · 40 of 40 positions shown · non-contrast
Comparison: None available

FINAL REPORT:
HISTORY: Shoulder pain
Procedure: MRI arthrogram of the left shoulder
TECHNIQUE: Multisequence, multiplanar imaging of the left shoulder was performed. Images acquired following direct arthrography as detailed in the separate report

[Series 1: survey · coronal · left · 1.6mm · 1.56mm/px · 9 of 120 slices shown (1 of 2)]
[im 1/120]
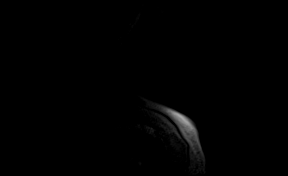
[im 15/120]
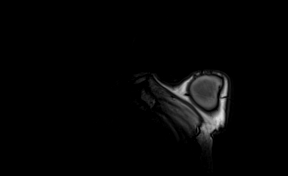
[im 30/120]
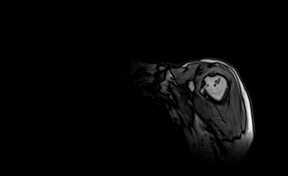
[im 45/120]
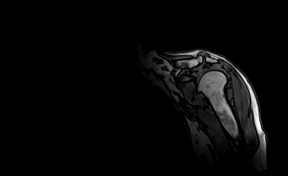
[im 60/120]
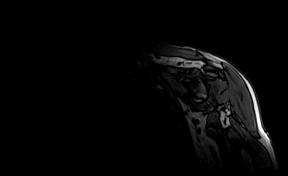
[im 75/120]
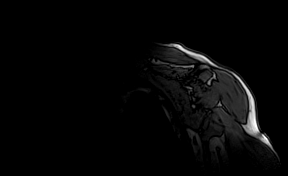
[im 90/120]
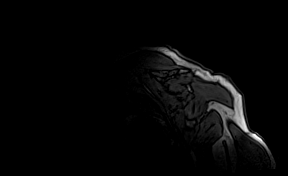
[im 105/120]
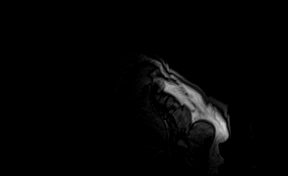
[im 120/120]
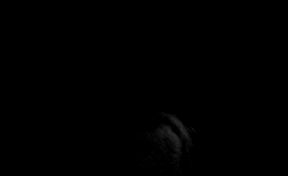

[Series 2: survey_mpr_sag · oblique · left · 1.6mm · 1.56mm/px · 1 of 9 slices shown (1 of 2)]
[im 1/9]
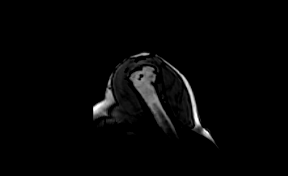

[Series 3: survey_mpr_cor · oblique · left · 1.6mm · 1.56mm/px · 1 of 11 slices shown (1 of 2)]
[im 1/11]
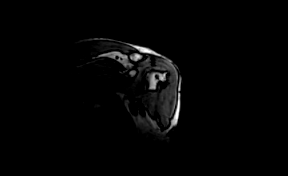

[Series 4: survey_mpr_(person_name) · axial · left · 1.6mm · 1.56mm/px · 1 of 15 slices shown (1 of 2)]
[im 1/15]
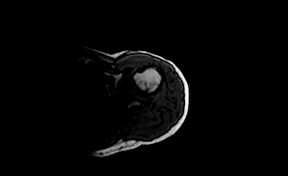

[Series 5: PD fat-sat · axial · left · 3.0mm · 0.47mm/px · z∈[-70,+28]mm · 2 of 32 slices shown]
[im 1/32]
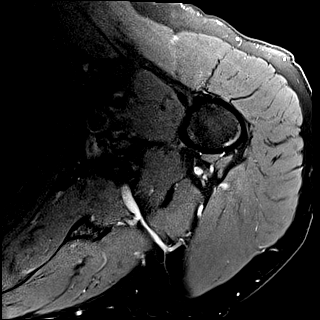
[im 32/32]
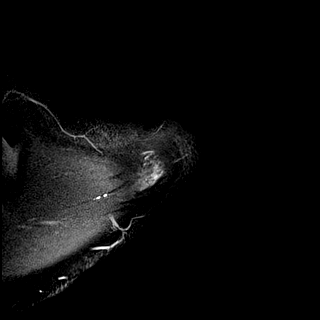

[Series 6: PD · oblique · left · 3.0mm · 0.47mm/px · 2 of 27 slices shown]
[im 1/27]
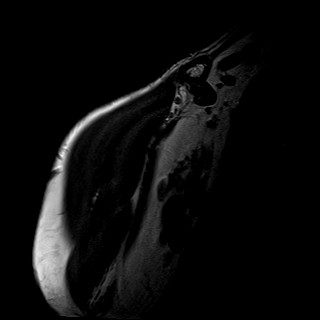
[im 27/27]
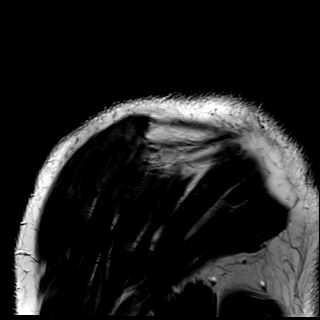

[Series 7: T2 fat-sat · oblique · left · 3.0mm · 0.47mm/px · 2 of 27 slices shown (1 of 2)]
[im 1/27]
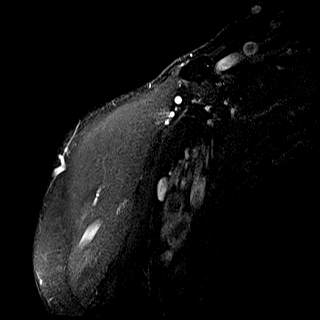
[im 27/27]
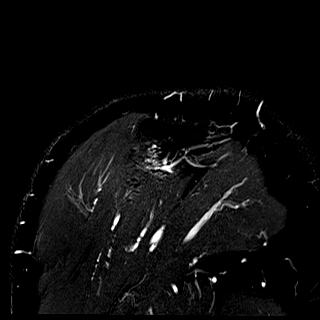

[Series 8: T2 fat-sat · oblique · left · 3.0mm · 0.47mm/px · 2 of 27 slices shown (2 of 2)]
[im 1/27]
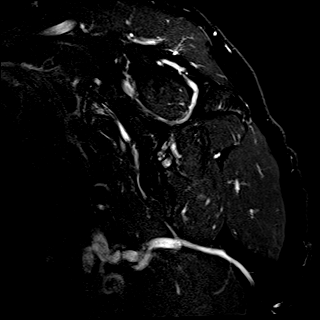
[im 27/27]
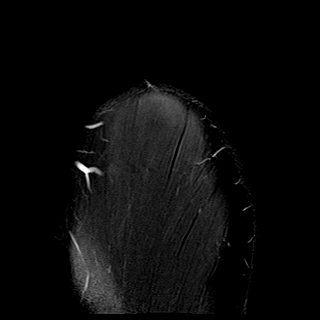

[Series 9: T1 · oblique · left · 3.0mm · 0.39mm/px · 2 of 27 slices shown]
[im 1/27]
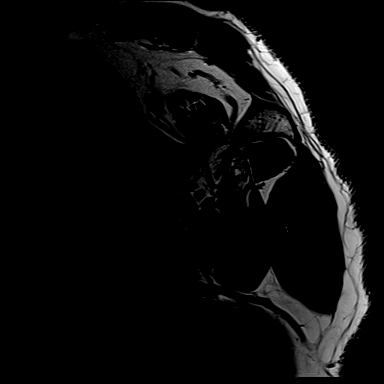
[im 27/27]
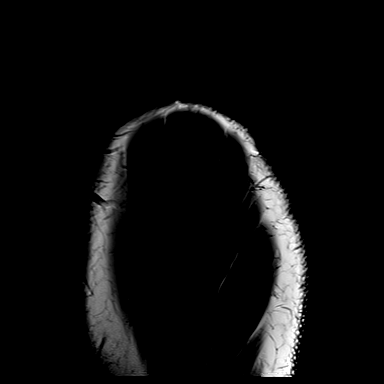

[Series 10: survey · coronal · right · 1.6mm · 1.56mm/px · 9 of 120 slices shown (2 of 2)]
[im 1/120]
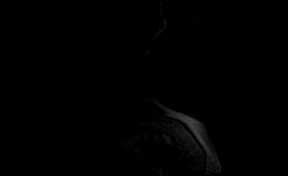
[im 15/120]
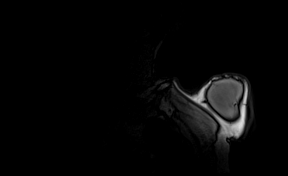
[im 30/120]
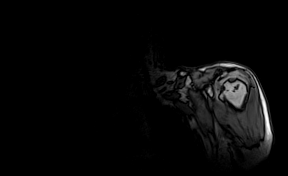
[im 45/120]
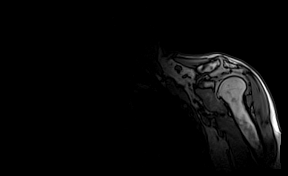
[im 60/120]
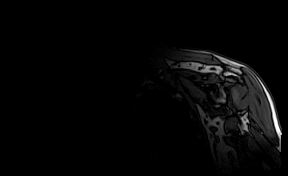
[im 75/120]
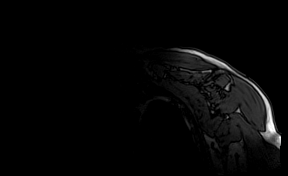
[im 90/120]
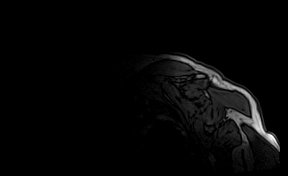
[im 105/120]
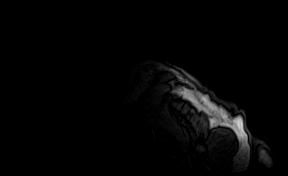
[im 120/120]
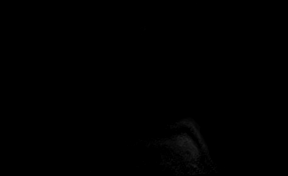

[Series 11: survey_mpr_sag · oblique · right · 1.6mm · 1.56mm/px · 1 of 9 slices shown (2 of 2)]
[im 1/9]
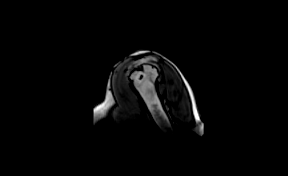

[Series 12: survey_mpr_cor · oblique · right · 1.6mm · 1.56mm/px · 1 of 11 slices shown (2 of 2)]
[im 1/11]
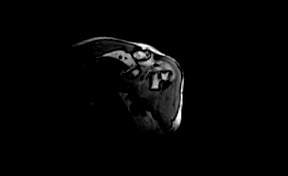

[Series 13: survey_mpr_(person_name) · axial · right · 1.6mm · 1.56mm/px · 1 of 15 slices shown (2 of 2)]
[im 1/15]
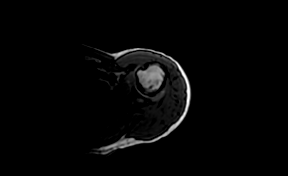

[Series 14: T1 fat-sat post-contrast · axial · right · 3.0mm · 0.47mm/px · z∈[-62,+39]mm · 2 of 32 slices shown (1 of 3)]
[im 1/32]
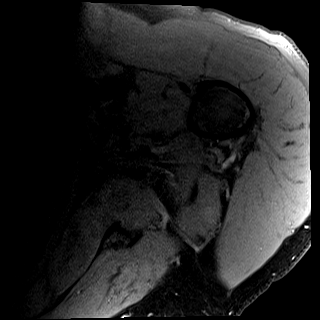
[im 32/32]
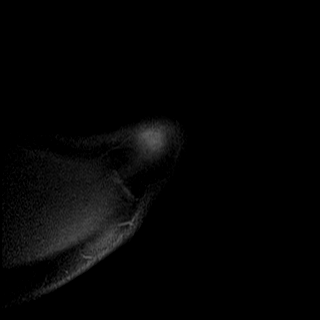

[Series 16: T1 fat-sat post-contrast · oblique · right · 3.0mm · 0.47mm/px · 2 of 27 slices shown (2 of 3)]
[im 1/27]
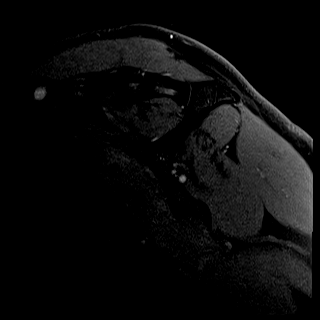
[im 27/27]
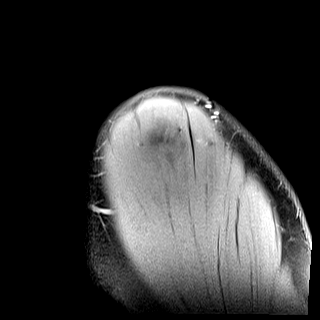

[Series 17: T1 fat-sat post-contrast · oblique · right · 3.0mm · 0.47mm/px · 2 of 25 slices shown (3 of 3)]
[im 1/25]
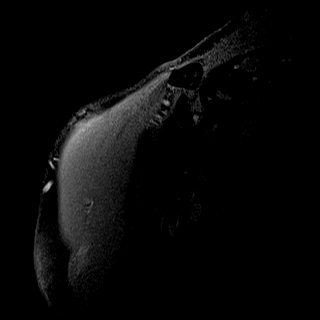
[im 25/25]
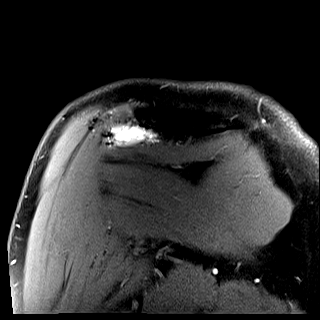

[40 of 40 positions shown; findings below may reference images not displayed]

FINDINGS: Anchors in the humeral head. Moderate glenohumeral degenerative changes. Partial resection of the acromioclavicular joint. There is evidence of subscapularis tendinopathy and possible partial tear. There is irregularity of the biceps, the intracapsular portion is not seen, correlate clinically for prior surgery. There is evidence of prior supraspinatus repair. No full-thickness retracted tear is seen and some intact fibers are present. There is an articular surface partial tear of the infraspinatus extending into the myotendinous junction. Moderately large fluid in the subacromial subdeltoid bursa. The teres minor is unremarkable. There is a tear of the inferior fibers of the subscapularis retracted to the glenohumeral joint line. Irregularity and increased signal in the inferior labrum, question tear
IMPRESSION: 
IMPRESSION: 1. Full-thickness retracted tear of the most inferior subscapularis. The uppermost fibers remain intact
2. Diminutive supraspinatus repair appears to contain intact fibers. No retracted complete tear is evident.
3. Articular surface tearing of the infraspinatus extending to the myotendinous junction
4. Intracapsular biceps not seen, correlate with surgical history
5. Moderate glenohumeral degenerative changes

## 2021-06-13 IMAGING — MR MRI SHOULDER ARTHROGRAM RIGHT
17 series · 40 of 40 positions shown · non-contrast
Comparison: None available

FINAL REPORT:
HISTORY: Shoulder impingement
Procedure: MRI arthrogram of the right shoulder
TECHNIQUE: Multisequence, multiplanar imaging of the right shoulder was performed. Images acquired following direct arthrography as detailed in a separate report

[Series 1: survey · coronal · right · 1.6mm · 1.56mm/px · 8 of 120 slices shown (1 of 2)]
[im 1/120]
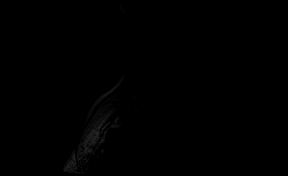
[im 18/120]
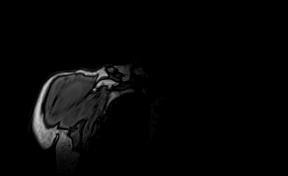
[im 35/120]
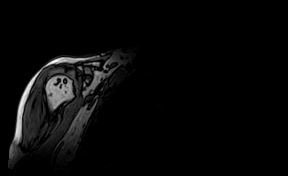
[im 52/120]
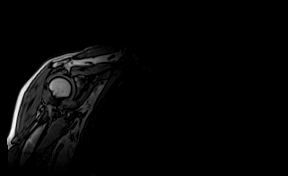
[im 69/120]
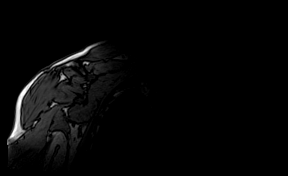
[im 86/120]
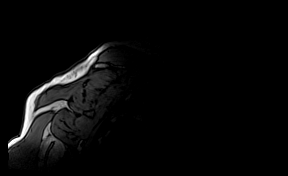
[im 103/120]
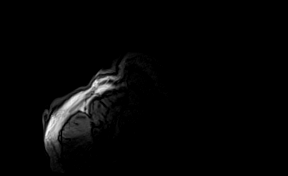
[im 120/120]
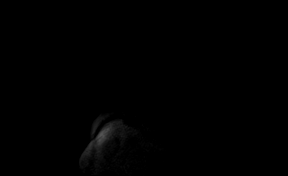

[Series 2: survey_mpr_sag · oblique · right · 1.6mm · 1.56mm/px · 1 of 9 slices shown (1 of 2)]
[im 1/9]
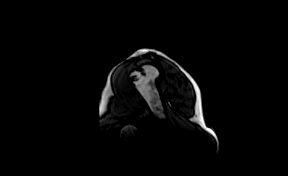

[Series 3: survey_mpr_cor · oblique · right · 1.6mm · 1.56mm/px · 1 of 11 slices shown (1 of 2)]
[im 1/11]
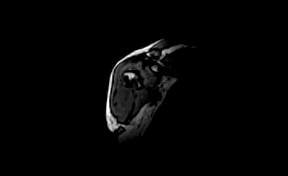

[Series 4: survey_mpr_(person_name) · axial · right · 1.6mm · 1.56mm/px · 1 of 15 slices shown (1 of 2)]
[im 1/15]
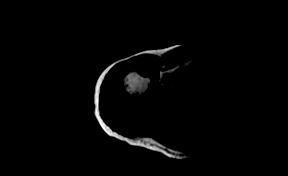

[Series 5: PD fat-sat · axial · right · 3.0mm · 0.47mm/px · 1 of 32 slices shown (1 of 2)]
[im 1/32]
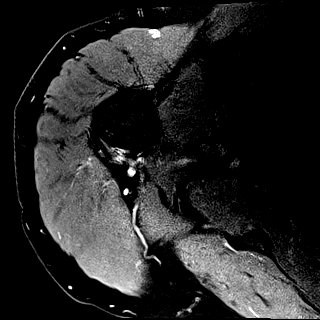

[Series 6: PD · oblique · right · 3.0mm · 0.47mm/px · 2 of 25 slices shown]
[im 1/25]
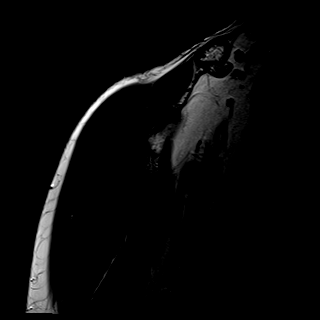
[im 25/25]
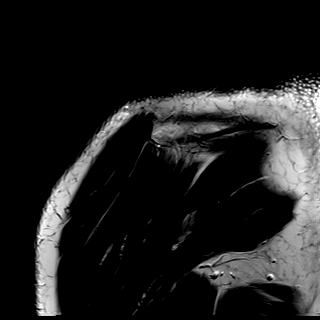

[Series 7: T2 fat-sat · oblique · right · 3.0mm · 0.47mm/px · 2 of 25 slices shown (1 of 2)]
[im 1/25]
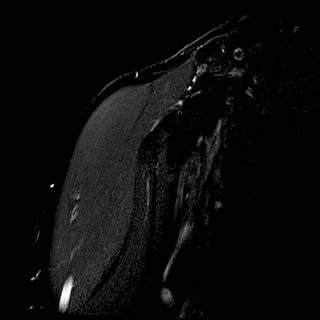
[im 25/25]
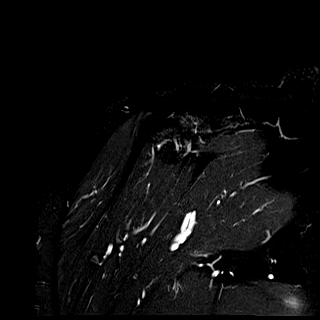

[Series 8: T2 fat-sat · oblique · right · 3.0mm · 0.47mm/px · 2 of 27 slices shown (2 of 2)]
[im 1/27]
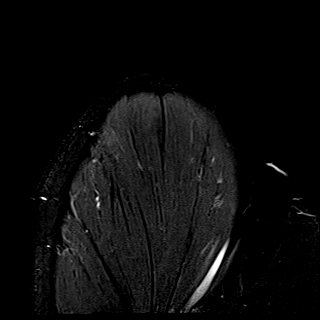
[im 27/27]
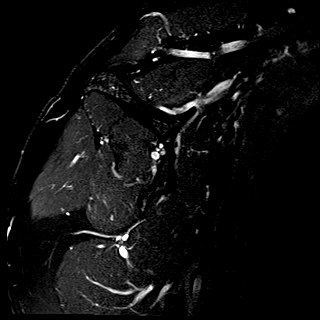

[Series 9: T1 · oblique · right · 3.0mm · 0.39mm/px · 2 of 27 slices shown]
[im 1/27]
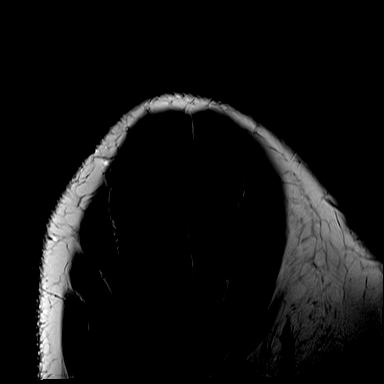
[im 27/27]
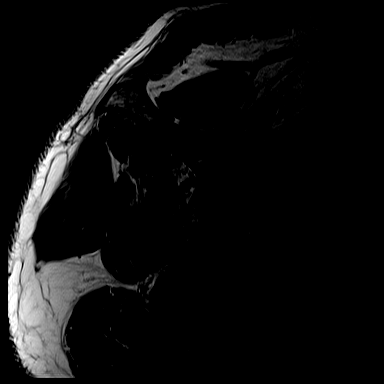

[Series 10: PD fat-sat · axial · right · 3.0mm · 0.47mm/px · z∈[-57,+37]mm · 2 of 32 slices shown (2 of 2)]
[im 1/32]
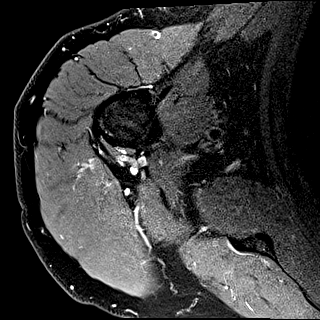
[im 32/32]
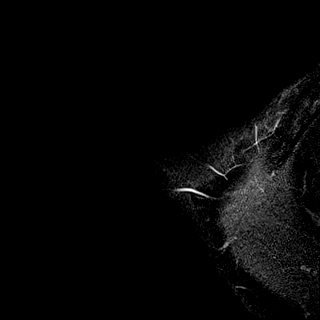

[Series 11: survey · coronal · right · 1.6mm · 1.56mm/px · 9 of 120 slices shown (2 of 2)]
[im 1/120]
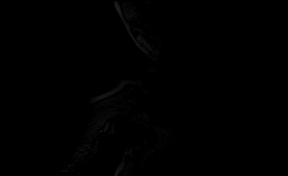
[im 15/120]
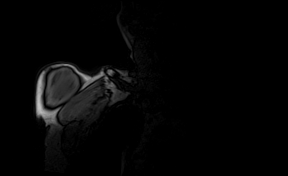
[im 30/120]
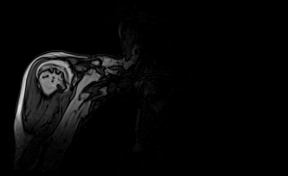
[im 45/120]
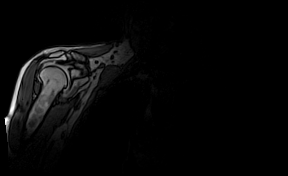
[im 60/120]
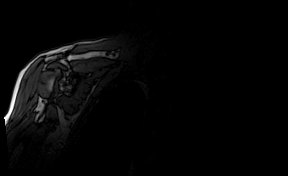
[im 75/120]
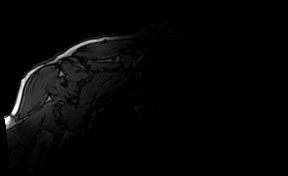
[im 90/120]
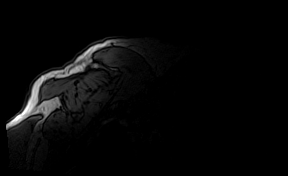
[im 105/120]
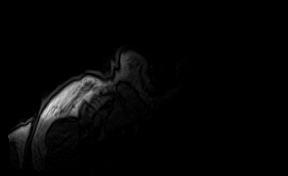
[im 120/120]
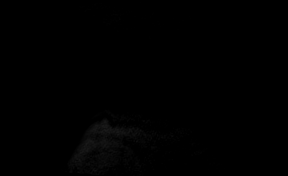

[Series 12: survey_mpr_sag · oblique · right · 1.6mm · 1.56mm/px · 1 of 9 slices shown (2 of 2)]
[im 1/9]
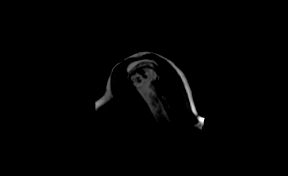

[Series 13: survey_mpr_cor · oblique · right · 1.6mm · 1.56mm/px · 1 of 11 slices shown (2 of 2)]
[im 1/11]
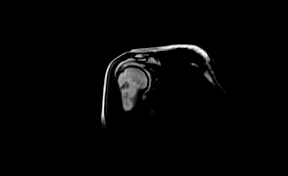

[Series 14: survey_mpr_(person_name) · axial · right · 1.6mm · 1.56mm/px · 1 of 15 slices shown (2 of 2)]
[im 1/15]
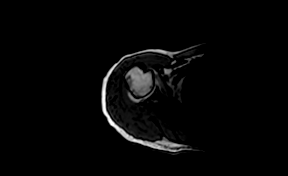

[Series 15: T1 fat-sat post-contrast · axial · right · 3.0mm · 0.47mm/px · z∈[-50,+51]mm · 2 of 32 slices shown (1 of 3)]
[im 1/32]
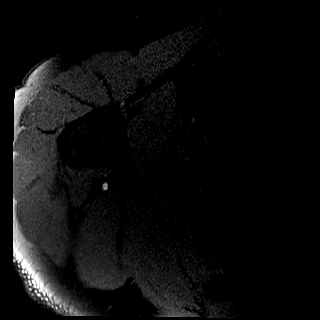
[im 32/32]
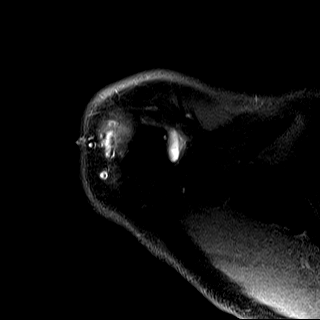

[Series 16: T1 fat-sat post-contrast · oblique · right · 3.0mm · 0.47mm/px · 2 of 25 slices shown (2 of 3)]
[im 1/25]
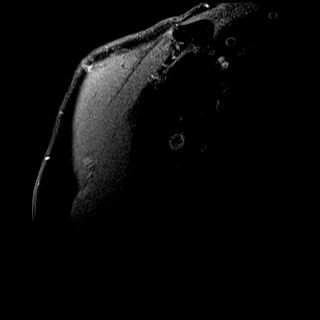
[im 25/25]
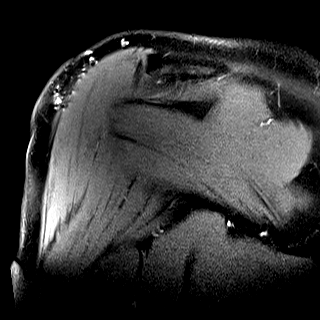

[Series 17: T1 fat-sat post-contrast · oblique · right · 3.0mm · 0.50mm/px · 2 of 27 slices shown (3 of 3)]
[im 1/27]
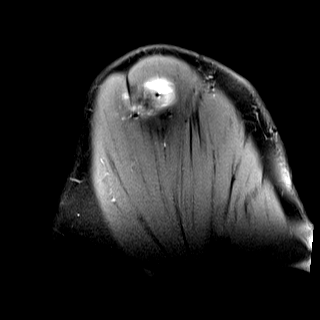
[im 27/27]
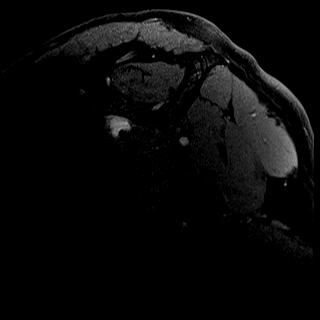

[40 of 40 positions shown; findings below may reference images not displayed]

FINDINGS: Multiple anchor tracks are visible in the humeral head. Mild-to-moderate glenohumeral degenerative changes. There is widening of the acromioclavicular joint suggesting subacromial decompression/acromioplasty. There is a defect in the midportion of the supraspinatus. However, this may be postsurgical in nature. There is tendinopathy versus partial tear of the native infraspinatus with an intrasubstance partial tear at the myotendinous junction. Questionable articular surface delamination. The teres minor is normal. The intracapsular biceps is not well seen. Subscapularis is unremarkable. Contrast extends into the subacromial subdeltoid bursa is nonspecific. There is a tear of the posterior labrum extending to the [DATE] position inferiorly. No intra-articular bodies are noted.
IMPRESSION: 
IMPRESSION: 1. Labral tear predominantly posterior and inferior
2. Postsurgical change of the humeral head likely accounts for the appearance of the supraspinatus.
3. Partial tear of the infraspinatus with some possible articular surface delamination anteriorly.
4. Mild-to-moderate glenohumeral degenerative changes.
5. Postsurgical changes of the humeral head and acromioclavicular joint

## 2021-07-25 IMAGING — CT CT SHOULDER LEFT WITHOUT IV CONTRAST
3 of 4 series · 16 of 33 positions shown, 19 images · non-contrast
Comparison: MRI of 06/13/2021

Arthritis of left shoulder region. History of surgery to left shoulder, planning for another surgery.
FINAL REPORT:
HISTORY: Left shoulder osteoarthritis
Procedure: CT of the left shoulder without contrast
TECHNIQUE: Multiplanar unenhanced images reconstructed through the left shoulder

[Series 5: thins stand · axial · 0.55mm/px · z∈[-222,-33]mm · 8 of 475 slices shown, 10 images (1 of 3)]
[im 48/475  soft-tissue]
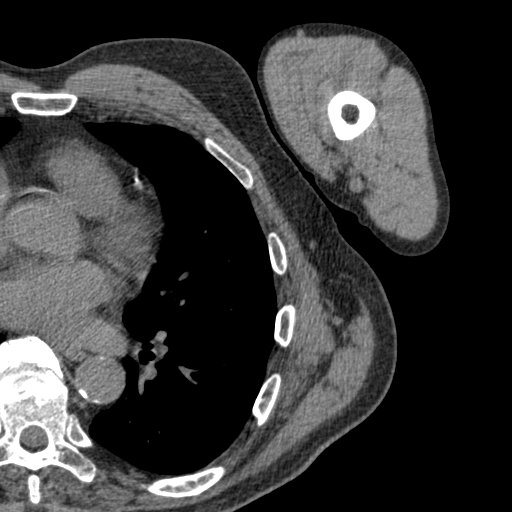
[im 48/475  bone]
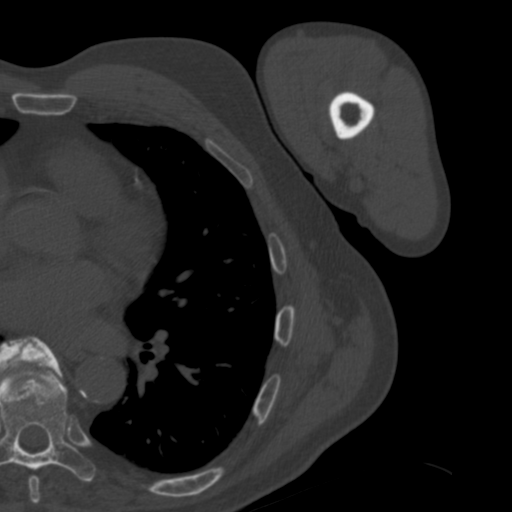
[im 95/475  bone]
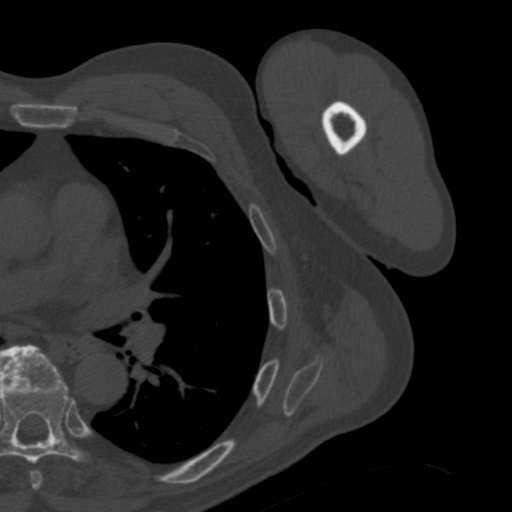
[im 143/475  bone]
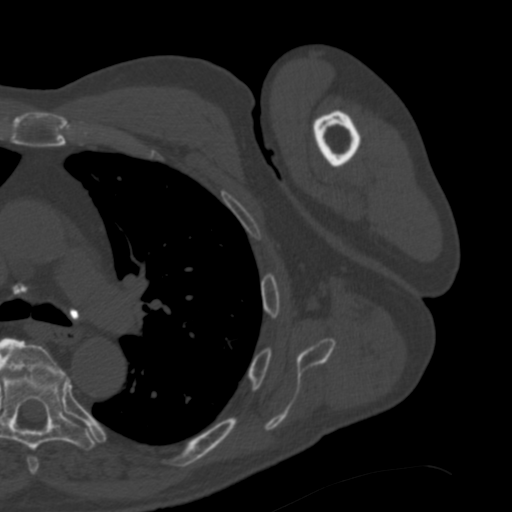
[im 190/475  bone]
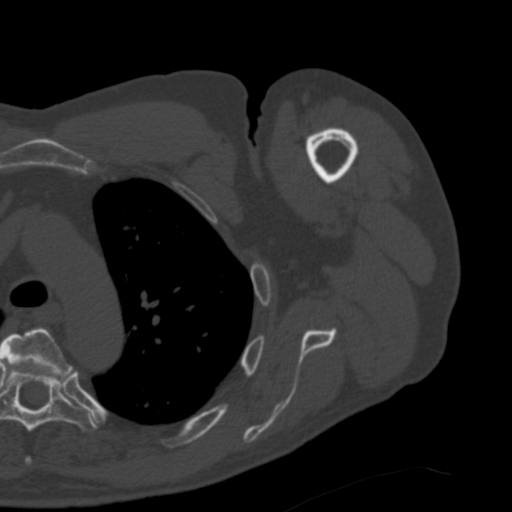
[im 285/475  soft-tissue]
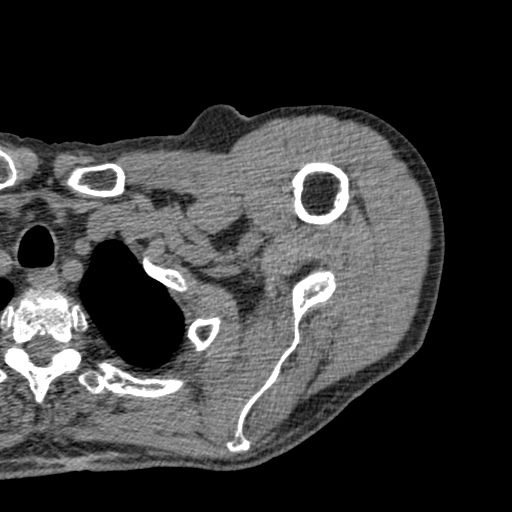
[im 285/475  bone]
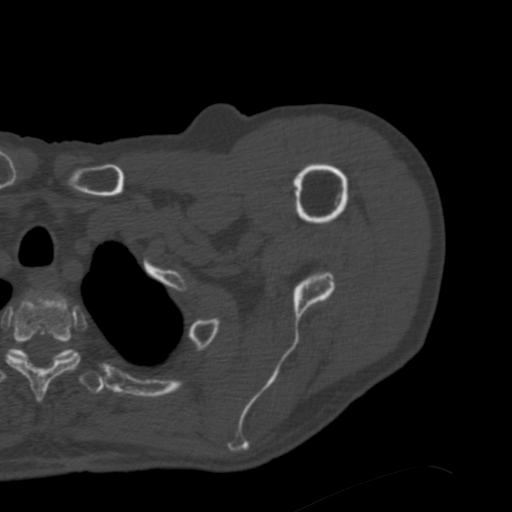
[im 332/475  bone]
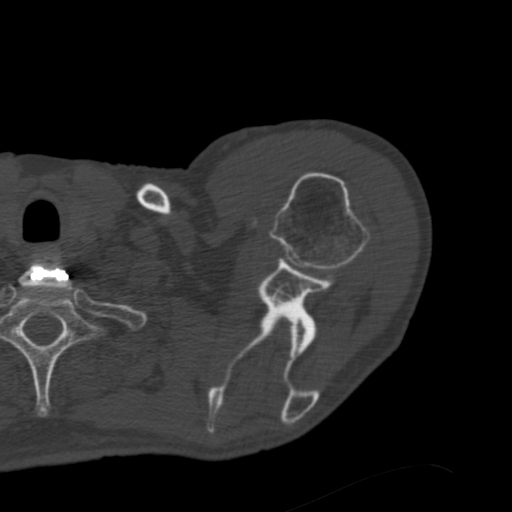
[im 380/475  bone]
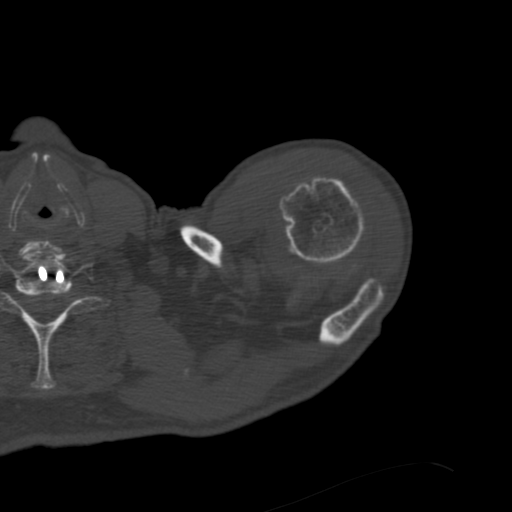
[im 427/475  bone]
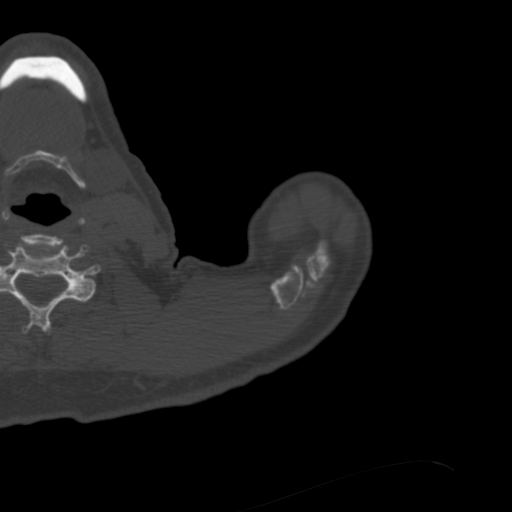

[thins stand · coronal · 0.55mm/px · 3 of 128 slices shown (2 of 3)]
[im 26/128  bone]
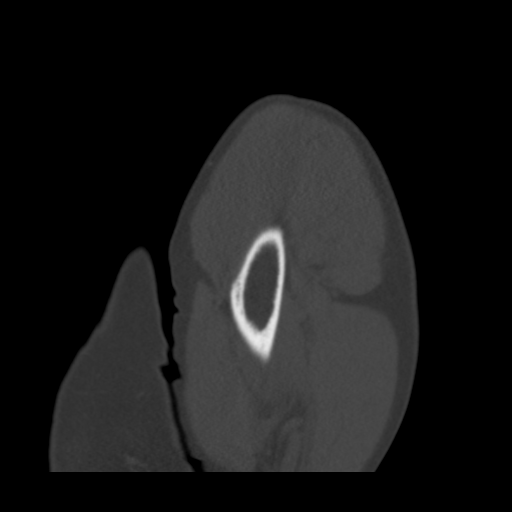
[im 51/128  bone]
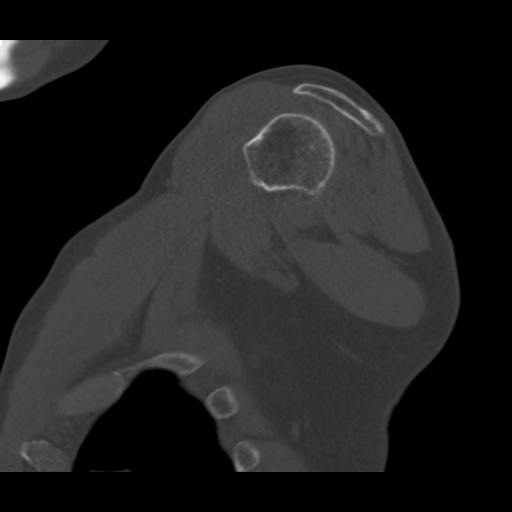
[im 77/128  bone]
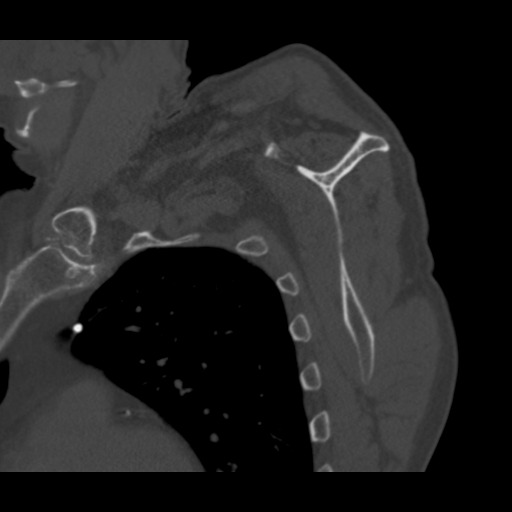

[thins stand · sagittal · 0.55mm/px · 5 of 113 slices shown, 6 images (3 of 3)]
[im 38/113  bone]
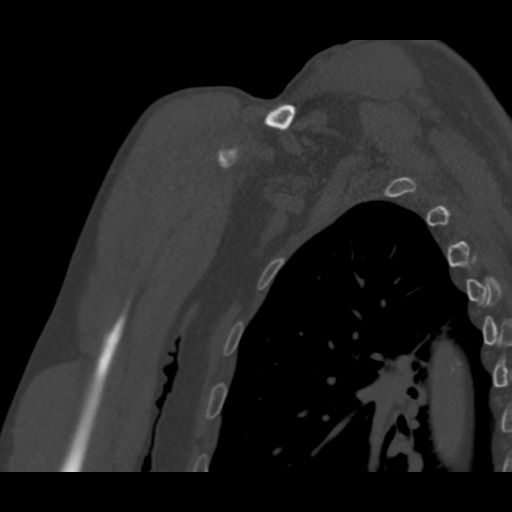
[im 47/113  bone]
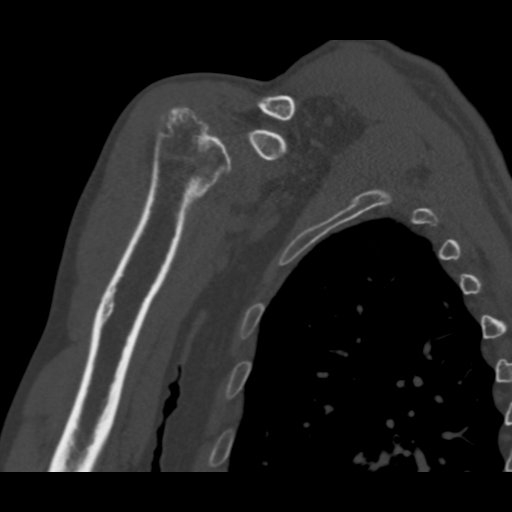
[im 57/113  soft-tissue]
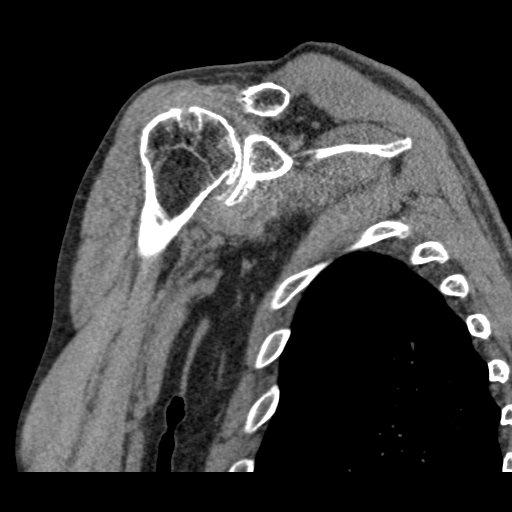
[im 57/113  bone]
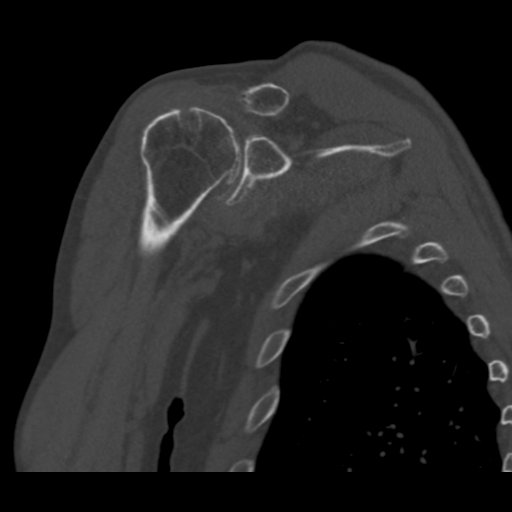
[im 66/113  bone]
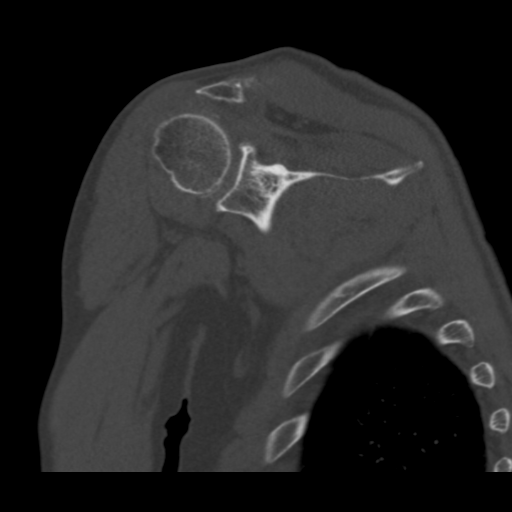
[im 75/113  bone]
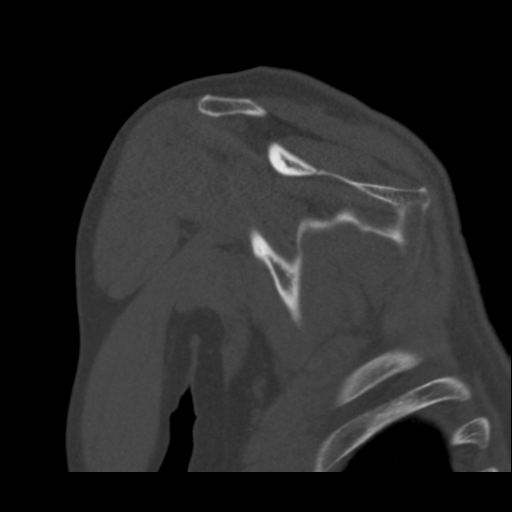

[16 of 33 positions shown; findings below may reference images not displayed]

FINDINGS: Cervical degenerative change and prior fusion. The ribs are unremarkable. Visible left lung is unremarkable. The scapula is intact. There is moderately severe glenohumeral osteoarthritis. Severe narrowing of the coracohumeral interval due to anterior displacement of the humeral head. Moderate acromioclavicular degenerative changes. Calcific tendinitis is seen in the infraspinatus. Moderate atherosclerosis noted.
IMPRESSION: 
IMPRESSION: 1. Moderately severe glenohumeral osteoarthritis.
2. Severe narrowing of the coracohumeral interval which can cause impingement
3. Moderate acromioclavicular degenerative changes
4. Calcific tendinitis of the infraspinatus
All CT scans at this facility use dose modulation and/or weight based dosing when appropriate to reduce radiation dose to as low as reasonably achievable.

## 2022-01-12 IMAGING — CR XR SHOULDER 2+ VIEWS LEFT
1 series · 3 of 3 positions shown · non-contrast
Comparison: None
There is previous left shoulder replacement.

FINAL REPORT:
Left shoulder 3 views:
CLINICAL INDICATION: Nosa Tiger"[place]." h/o recent sx to L shoulder

[Series 7940: AP · right · 3 of 3 slices shown]
[im 1/3]
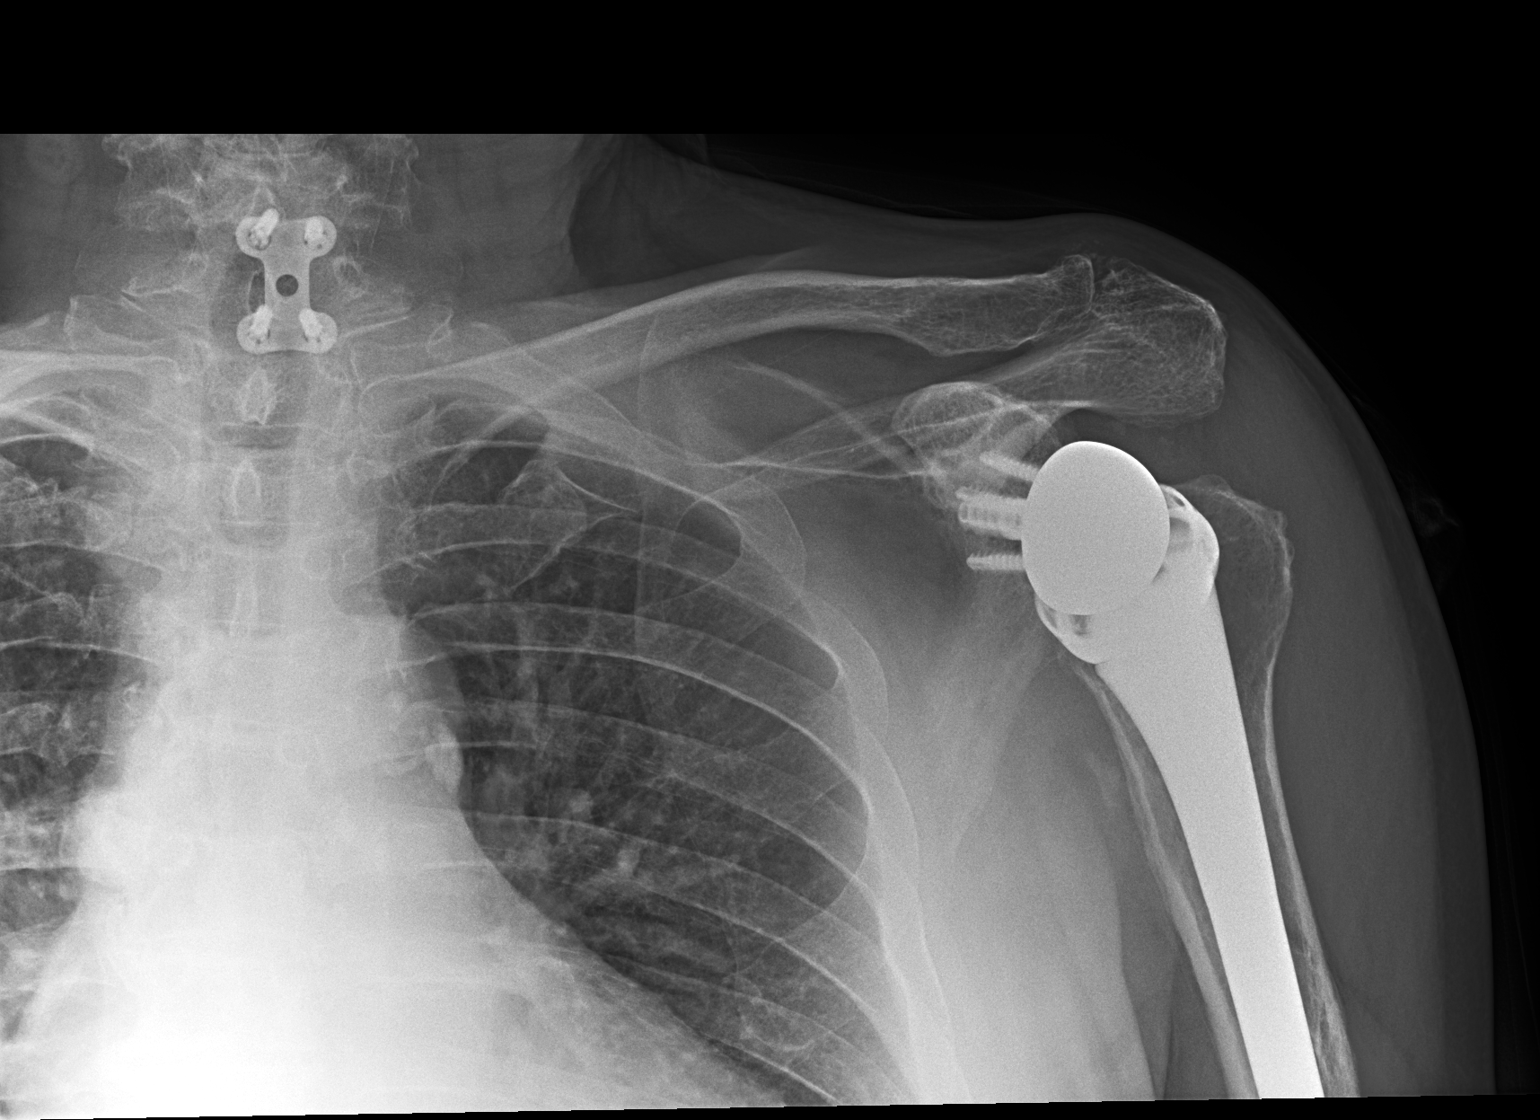
[im 2/3]
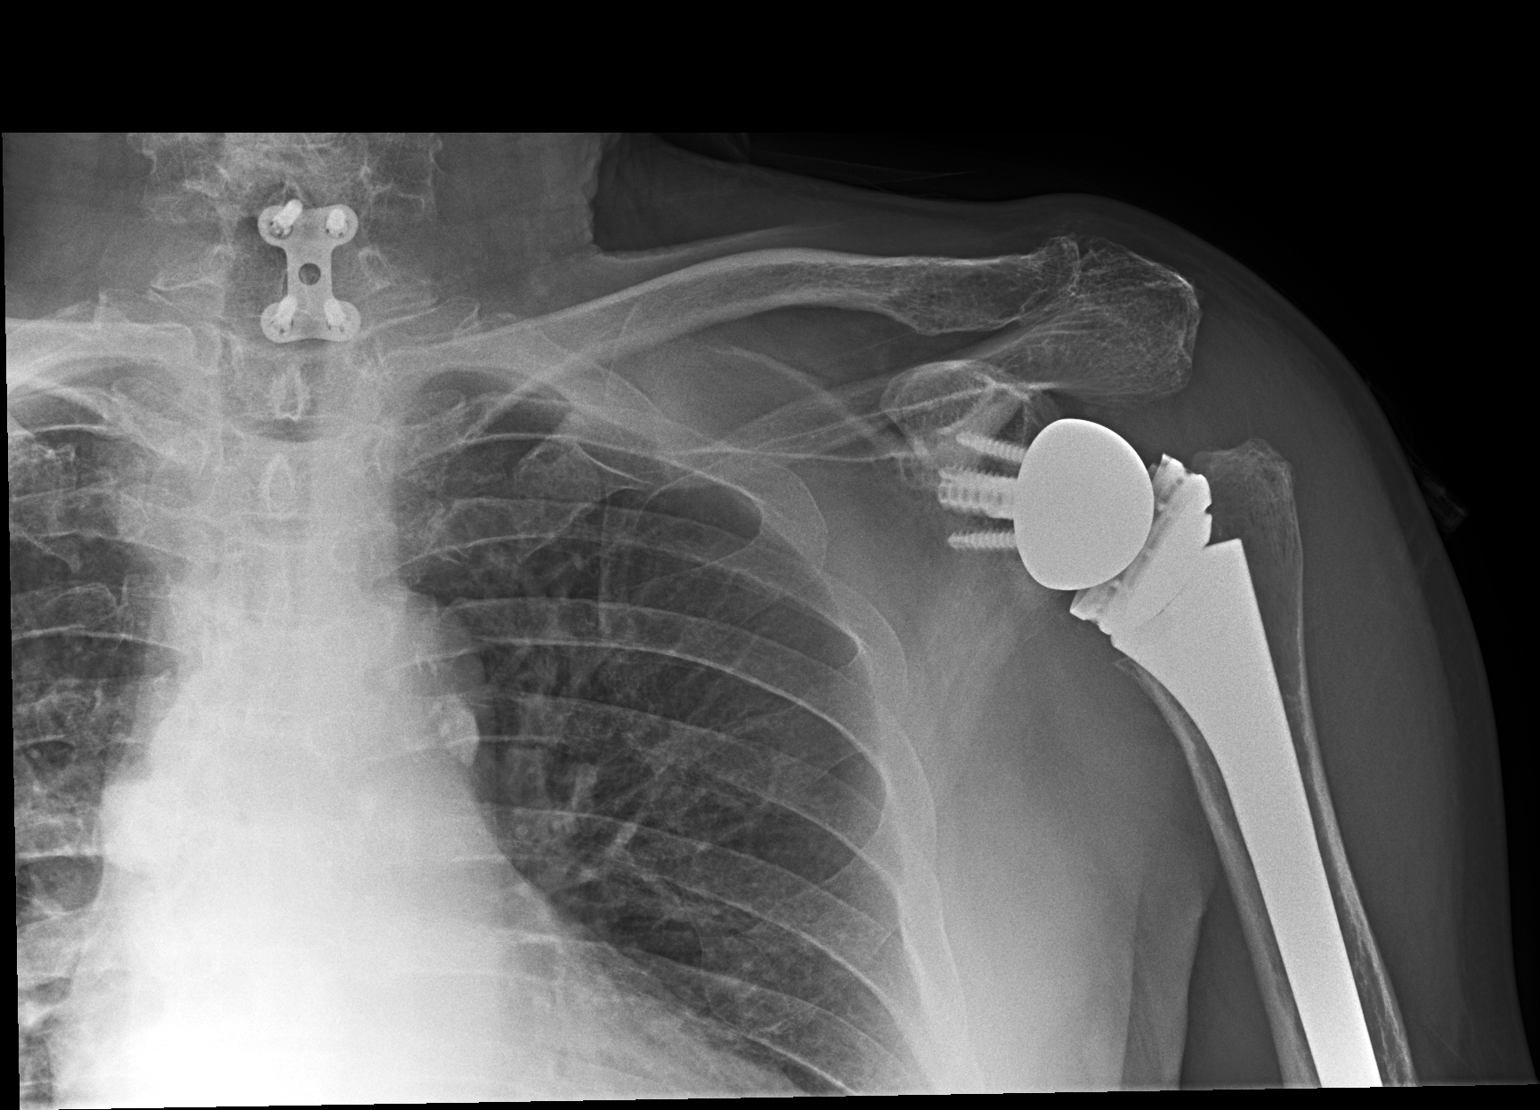
[im 3/3]
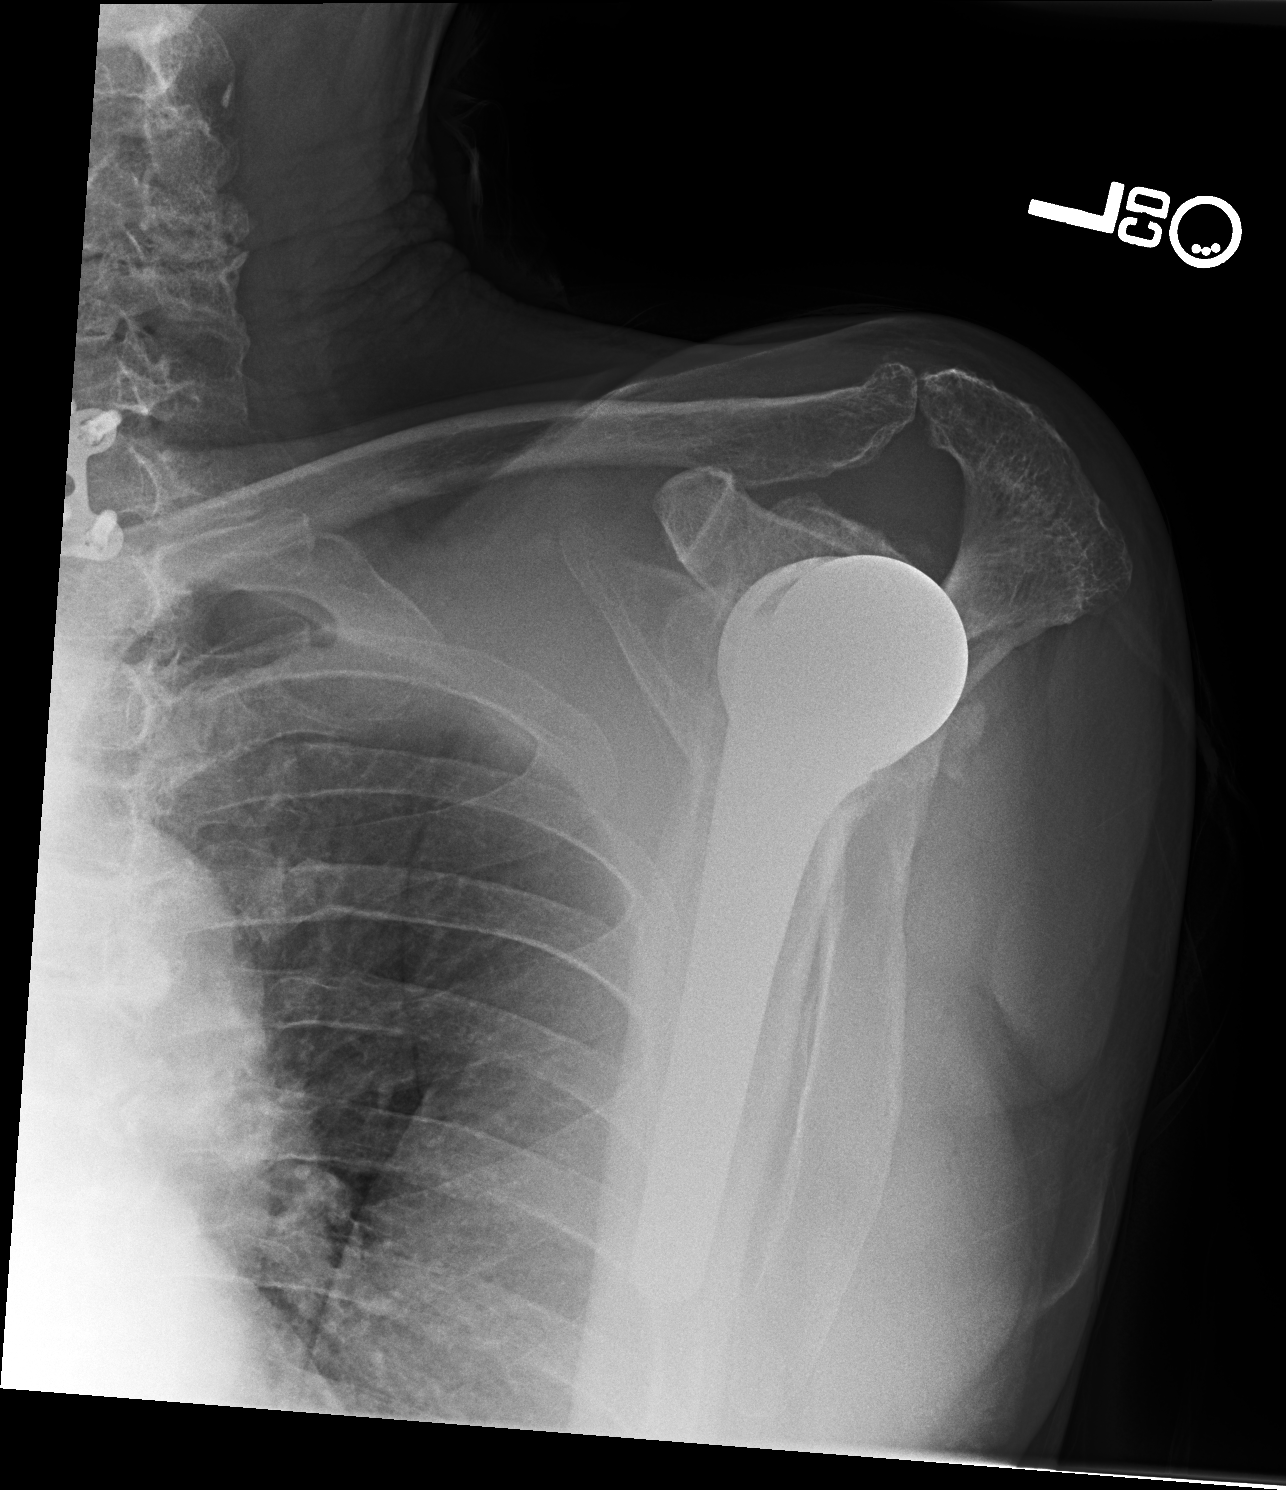

[3 of 3 positions shown; findings below may reference images not displayed]

No acute fracture is seen. There is no joint dislocation. The soft tissues appear unremarkable.
IMPRESSION: Unremarkable left shoulder replacement.

## 2022-07-27 NOTE — Progress Notes (Signed)
Formatting of this note is different from the original.  Subjective   Patient ID:  RG G Lizama is a 72 y.o. male who presents with Sinus Problem (Drainage. Started 4 days ago. Poss sinus infection. )    HPI    The Larock is a 72 year old male who presents to [facility] urgent care [place] with complaints of sinus infection.  States that this is day 4-5 of symptoms.  He endorses intermittent sinus congestion, runny nose, cough, postnasal drip and maxillary facial pain.  He denies being exposed to any sick contacts but he does drive a schoolbus.  He denies ear pain, sore throat, nausea, vomiting, diarrhea and fevers.  He is eating and drinking normally.  He is utilizing NeilMed sinus rinse that he obtained from the [place], Flonase and Astelin nasal spray.    The following portions of the patient's chart were reviewed in this encounter and updated as appropriate:  Tobacco  Allergies  Meds  Problems  Med Hx  Surg Hx  Fam Hx      as of 07/27/2022.     Review of Systems  Pertinent positives and negatives as detailed above.    Objective   Vitals:    07/27/22 0932   BP: 134/76   Pulse: 73   Resp: 16   Temp: 36.7 C (98 F)   SpO2: 98%   Weight: 90.7 kg (200 lb)   Height: 5\' 7"  (1.702 m)     Physical Exam  Constitutional:       General: He is not in acute distress.     Appearance: Normal appearance. He is well-developed. He is not ill-appearing, toxic-appearing or diaphoretic.   HENT:      Head: Normocephalic and atraumatic.      Right Ear: Tympanic membrane normal.      Left Ear: A middle ear effusion is present.      Nose: Congestion and rhinorrhea present.      Mouth/Throat:      Mouth: Mucous membranes are moist.      Pharynx: Posterior oropharyngeal erythema present. No oropharyngeal exudate.      Tonsils: No tonsillar exudate.   Eyes:      Extraocular Movements: Extraocular movements intact.      Conjunctiva/sclera: Conjunctivae normal.   Cardiovascular:      Rate and Rhythm: Normal rate and regular rhythm.      Heart  sounds: Normal heart sounds. No murmur heard.  Pulmonary:      Effort: Pulmonary effort is normal.      Breath sounds: Normal breath sounds.   Abdominal:      General: Bowel sounds are normal.      Palpations: Abdomen is soft.      Tenderness: There is no abdominal tenderness.   Musculoskeletal:      Cervical back: Normal range of motion and neck supple.   Lymphadenopathy:      Cervical: No cervical adenopathy.   Skin:     General: Skin is warm and dry.      Capillary Refill: Capillary refill takes less than 2 seconds.      Coloration: Skin is not jaundiced.   Neurological:      Mental Status: He is alert and oriented to person, place, and time.   Psychiatric:         Mood and Affect: Mood normal.         Behavior: Behavior normal.         Thought Content: Thought content normal.  Procedures   No results found for this or any previous visit (from the past 24 hour(s)).     Assessment/Plan   Diagnoses and all orders for this visit:  Acute non-recurrent maxillary sinusitis  -     amoxicillin-clavulanate (Augmentin) 875-125 mg per tablet; Take 1 tablet by mouth 2 (two) times a day for 10 days.  Non-recurrent acute suppurative otitis media of left ear without spontaneous rupture of tympanic membrane  -     amoxicillin-clavulanate (Augmentin) 875-125 mg per tablet; Take 1 tablet by mouth 2 (two) times a day for 10 days.  Stuffy and runny nose  Post-nasal drip  Acute cough  -     benzonatate (TESSALON) 200 mg capsule; Take 1 capsule (200 mg total) by mouth 3 (three) times a day if needed for cough for up to 7 days. Do not crush or chew.  Sinus pressure  -     methylPREDNISolone acetate (DEPO-Medrol) injection 80 mg  -aCute maxillary sinusitis and otitis media of the left middle ear.  Etiology is uncertain.  Acute viral upper respiratory infection and flareup from allergic rhinitis are differentials  -Declined point-of-care testing  -We will treat with antibiotic as detailed above.  Recommend that he continue NeilMed  sinus rinse, Flonase and Astelin nasal spray  -We will treat symptoms with medications as detailed above  -Supportive care discussed with recommendations made  -OTC analgesics as needed for fever and pain  -Encouraged hydration and ambulation  -Reviewed indications to seek emergent reevaluation.  Reviewed indications to seek follow-up with PCP.  -Answered all questions to the best of my ability and patient endorsed understanding appreciation for visit    Portions of this note may be dictated using Dragon Naturally Speaking voice recognition software. Variances in spelling and vocabulary are possible and unintentional. Not all errors are caught/corrected. Please notify the [facility] if any discrepancies are noted or if the meaning of any statement is not clear.     Patient's Medications   New Prescriptions    AMOXICILLIN-CLAVULANATE (AUGMENTIN) 875-125 MG PER TABLET    Take 1 tablet by mouth 2 (two) times a day for 10 days.    BENZONATATE (TESSALON) 200 MG CAPSULE    Take 1 capsule (200 mg total) by mouth 3 (three) times a day if needed for cough for up to 7 days. Do not crush or chew.   Previous Medications    ALBUTEROL HFA (PROVENTIL HFA) 90 MCG/ACTUATION INHALER    Inhale 2 puffs every 4 (four) hours if needed (chest tightness) for up to 10 days.    ASPIRIN 81 MG CAPSULE    Take 1 tablet by mouth every night.    ATORVASTATIN (LIPITOR) 80 MG TABLET    Take 80 mg by mouth 1 (one) time each day.    CARVEDILOL (COREG) 6.25 MG TABLET    Take 1 tablet (6.25 mg total) by mouth 2 (two) times a day with meals.    CETIRIZINE (ZYRTEC) 10 MG TABLET    Take 1 tablet (10 mg total) by mouth 1 (one) time each day.    CLOPIDOGREL (PLAVIX) 75 MG TABLET    Take 75 mg by mouth 1 (one) time each day.     DEXCHLORPHENIRAM-PHENYLEPHRINE 2-10 MG TABLET    Take 2-10 mg by mouth 3 (three) times a day if needed (for drainage and congestion).    ERGOCALCIFEROL, VITAMIN D2, (VITAMIN D2 ORAL)    Take 10 mg by mouth 1 (one) time each day in the  morning.    EZETIMIBE (ZETIA) 10 MG TABLET    Take 1 tablet (10 mg total) by mouth 1 (one) time each day.    FINASTERIDE (PROSCAR) 5 MG TABLET    Take 5 mg by mouth every night. Do not crush, chew, or split.      FLUTICASONE PROPIONATE (FLONASE) 50 MCG/ACTUATION NASAL SPRAY    SPRAY 2 SPRAYS IN EACH NOSTRIL DAILY    HYDROCODONE-ACETAMINOPHEN (NORCO) 7.5-325 MG PER TABLET    Take 1 tablet by mouth every 6 (six) hours if needed for moderate pain (scale 5-7). Indications: pain    PANTOPRAZOLE (PROTONIX) 40 MG EC TABLET    Take 40 mg by mouth 1 (one) time each day before breakfast.     RANOLAZINE (RANEXA) 500 MG 12 HR TABLET    TAKE ONE TABLET BY MOUTH TWICE A DAY    SILDENAFIL (VIAGRA) 100 MG TABLET    Take 1 tablet (100 mg total) by mouth 1 (one) time each day if needed for erectile dysfunction.    TAMSULOSIN (FLOMAX) 0.4 MG 24 HR CAPSULE    Take 0.4 mg by mouth twice a day.    Modified Medications    No medications on file   Discontinued Medications    No medications on file       No follow-ups on file.  Go to the emergency department for worsening condition.  Electronically signed by Cresenciano Genre, MD at 07/27/2022  9:51 AM EDT

## 2022-07-31 ENCOUNTER — Inpatient Hospital Stay: Admit: 2022-07-31 | Discharge: 2022-07-31 | Disposition: A | Discharge status: Home / Self Care | Payer: MEDICARE

## 2022-07-31 ENCOUNTER — Emergency Department: Admit: 2022-07-31 | Payer: MEDICARE

## 2022-07-31 DIAGNOSIS — J40 Bronchitis, not specified as acute or chronic: Secondary | ICD-10-CM

## 2022-07-31 MED ORDER — PREDNISONE 20 MG PO TABS
20 | ORAL_TABLET | Freq: Two times a day (BID) | ORAL | 0 refills | Status: AC
Start: 2022-07-31 — End: 2022-08-05

## 2022-07-31 MED ORDER — AMOXICILLIN-POT CLAVULANATE 875-125 MG PO TABS
875-125 | ORAL_TABLET | Freq: Two times a day (BID) | ORAL | 0 refills | Status: AC
Start: 2022-07-31 — End: 2022-08-05

## 2022-07-31 MED ORDER — ALBUTEROL SULFATE HFA 108 (90 BASE) MCG/ACT IN AERS
108 | Freq: Four times a day (QID) | RESPIRATORY_TRACT | 0 refills | Status: AC | PRN
Start: 2022-07-31 — End: ?

## 2022-07-31 MED ORDER — ROBITUSSIN COUGH+CHEST CONG DM 10-200 MG PO CAPS
10-200 | ORAL_CAPSULE | Freq: Four times a day (QID) | ORAL | 0 refills | Status: AC
Start: 2022-07-31 — End: ?

## 2022-07-31 NOTE — ED Notes (Signed)
Returned from [employer], [place]  07/31/22 1109

## 2022-07-31 NOTE — ED Provider Notes (Signed)
[facility]  Urgent Care Encounter       CHIEF COMPLAINT       Chief Complaint   Patient presents with    Cough     " NOW IN MY CHEST. I was seen in [place] Friday and rx tessalon and amoxicillin got a steriod shot"       Nurses Notes reviewed and I agree except as noted in the HPI.  HISTORY OF PRESENT ILLNESS   Garrett Erickson is a 72 y.o. male who presents with concerns of worsening cough and congestion that has been ongoing for seven days. Reports was seen by an Urgent Care in [place] last Friday, was prescribed Tessalon and Amoxicillin, and was given a steroid shot. Reports feels as Lavella Lemons is not helping symptoms and feels as though congestion is settling into chest with worsening productive cough. Reports taking amoxicillin as previously prescribed.   Reports in [place] visiting family for the week.     HPI    REVIEW OF SYSTEMS     Review of Systems   Constitutional:  Positive for fatigue. Negative for fever.   HENT:  Positive for congestion and postnasal drip.    Respiratory:  Positive for cough (deep and productive). Negative for shortness of breath.    All other systems reviewed and are negative.      PAST MEDICAL HISTORY         Diagnosis Date    CAD (coronary artery disease)     Hyperlipidemia     Hypertension        SURGICALHISTORY     Patient  has no past surgical history on file.    CURRENT MEDICATIONS       Previous Medications    ASPIRIN 81 MG EC TABLET    Take 1 tablet by mouth daily    ATORVASTATIN (LIPITOR) 80 MG TABLET    Take 1 tablet by mouth daily    CARVEDILOL (COREG) 6.25 MG TABLET    Take 1 tablet by mouth 2 times daily (with meals)    CHOLECALCIFEROL (VITAMIN D) 50 MCG (2000 UT) CAPS CAPSULE    Take by mouth    CLOPIDOGREL (PLAVIX) 75 MG TABLET    Take 1 tablet by mouth daily    EZETIMIBE (ZETIA) 10 MG TABLET    Take 1 tablet by mouth daily    FINASTERIDE (PROSCAR) 5 MG TABLET    Take 1 tablet by mouth daily    PANTOPRAZOLE (PROTONIX) 40 MG TABLET    Take 1 tablet by mouth  daily    RANOLAZINE (RANEXA) 500 MG EXTENDED RELEASE TABLET    Take 1 tablet by mouth 2 times daily    TAMSULOSIN (FLOMAX) 0.4 MG CAPSULE    Take 1 capsule by mouth daily       ALLERGIES     Patient is is allergic to fioricet [butalbital-apap-caffeine].    Patients   Immunization History   Administered Date(s) Administered    COVID-19, PFIZER PURPLE top, DILUTE for use, (age 44 y+), 74mcg/0.3mL 07/08/2019, 07/29/2019       FAMILY HISTORY     Patient's family history is not on file.    SOCIAL HISTORY     Patient  reports that he has quit smoking. His smoking use included cigarettes. He does not have any smokeless tobacco history on file. He reports that he does not currently use alcohol. He reports that he does not currently use drugs.    PHYSICAL EXAM  ED TRIAGE VITALS  BP: 136/82,  , Pulse: 94, Respirations: 14, SpO2: 97 %,Estimated body mass index is 31.32 kg/m as calculated from the following:    Height as of this encounter: 1.702 m (5\' 7" ).    Weight as of this encounter: 90.7 kg (200 lb).,No LMP for male patient.    Physical Exam  Vitals and nursing note reviewed.   Constitutional:       General: He is not in acute distress.     Appearance: Normal appearance. He is obese. He is not ill-appearing, toxic-appearing or diaphoretic.   HENT:      Head:      Salivary Glands: Right salivary gland is not diffusely enlarged or tender. Left salivary gland is not diffusely enlarged or tender.      Right Ear: Tympanic membrane normal.      Left Ear: Tympanic membrane normal.      Nose: Congestion present.      Right Sinus: No maxillary sinus tenderness or frontal sinus tenderness.      Left Sinus: No maxillary sinus tenderness or frontal sinus tenderness.      Mouth/Throat:      Mouth: Mucous membranes are moist.      Pharynx: Oropharynx is clear. Uvula midline. No pharyngeal swelling, oropharyngeal exudate or posterior oropharyngeal erythema.   Eyes:      Pupils: Pupils are equal, round, and reactive to light.    Cardiovascular:      Rate and Rhythm: Normal rate and regular rhythm.      Heart sounds: Normal heart sounds.   Pulmonary:      Effort: Pulmonary effort is normal. No accessory muscle usage, prolonged expiration, respiratory distress or retractions.      Breath sounds: No stridor, decreased air movement or transmitted upper airway sounds. Examination of the right-lower field reveals wheezing. Examination of the left-lower field reveals wheezing. Wheezing present. No decreased breath sounds, rhonchi or rales.   Skin:     General: Skin is warm and dry.      Capillary Refill: Capillary refill takes less than 2 seconds.   Neurological:      General: No focal deficit present.      Mental Status: He is alert.   Psychiatric:         Mood and Affect: Mood normal.         Behavior: Behavior is cooperative.         DIAGNOSTIC RESULTS     Labs:No results found for this visit on 07/31/22.    IMAGING:    XR CHEST (2 VW)   Final Result   1. No acute cardiopulmonary finding.            **This report has been created using voice recognition software.  It may contain minor errors which are inherent in voice recognition technology.**      Final report electronically signed by Dr Varney Biles on 07/31/2022 11:17 AM            EKG:      URGENT CARE COURSE:     Vitals:    07/31/22 1041   BP: 136/82   Pulse: 94   Resp: 14   SpO2: 97%   Weight: 90.7 kg (200 lb)   Height: 1.702 m (5\' 7" )       Medications - No data to display         PROCEDURES:  None    FINAL IMPRESSION      1. Bronchitis  DISPOSITION/ PLAN     Patient seen and evaluated for the above. XR imaging of chest obtained. Discussed results with patient, aware no pulmonary consolidation, no pneumothorax, no pleura effusion, and no cardiopulmonary effusion as visualized and read by radiologist. Assessment consistent with acute bronchitis.  Patient is provided a prescription for prednisone, Augmentin, Albuterol Inhaler, and Robitussin. Instructed to stop previously  prescribed Amoxicillin, and start Augmentin today.  Instructed use over-the-counter Tylenol and Motrin for pain or fever. Instructed OTC Zyrtec, Flonase, or Mucinex for congestion. Instructed follow-up with PCP in 3 to 5 days worsening symptom.  Instructed to present to the emergency department for severe dyspnea or other conditions deemed emergent.  Patient is agreeable with the above plan and denies questions or concerns at this time.        PATIENT REFERRED TO:  No primary care provider on file.  No primary physician on file.      DISCHARGE MEDICATIONS:  New Prescriptions    AMOXICILLIN-CLAVULANATE (AUGMENTIN) 875-125 MG PER TABLET    Take 1 tablet by mouth 2 times daily for 5 days    DEXTROMETHORPHAN-GUAIFENESIN (ROBITUSSIN COUGH+CHEST CONG DM) 10-200 MG CAPS    Take 1 tablet by mouth in the morning, at noon, in the evening, and at bedtime    PREDNISONE (DELTASONE) 20 MG TABLET    Take 1 tablet by mouth 2 times daily for 5 days       Discontinued Medications    No medications on file       Current Discharge Medication List        CONTINUE these medications which have CHANGED    Details   albuterol sulfate HFA (VENTOLIN HFA) 108 (90 Base) MCG/ACT inhaler Inhale 2 puffs into the lungs every 6 hours as needed for Wheezing or Shortness of Breath  Qty: 18 g, Refills: 0             Dalaney Needle Lenox Ahr, APRN - CNP    (Please note that portions of this note were completed with a voice recognition program. Efforts were made to edit the dictations but occasionally words are mis-transcribed.)            Coralyn Pear, APRN - CNP  07/31/22 1128

## 2022-07-31 NOTE — ED Triage Notes (Signed)
To room 3 c/o cough  states he was seen at urgent care in [place] Friday for sinus problem that strted Wednesday.    Pt reports he was positive covid approx 6 weeks ago.   Pt reports he forgiot his Albuterol inhaler in [place] Respirations easy and unlabored

## 2022-07-31 NOTE — Discharge Instructions (Addendum)
Medications as prescribed.   Stop amoxicillin, start Augmentin as prescribe today.   Continue Flonase or trial Zyrtec, Mucinex for congestion.   Increase water intake, frequent hand washing.  Tylenol / Ibuprofen as needed for fever and or pain.  Follow up with PCP in 3-5 days if no improvement or sooner with worsening symptoms.

## 2022-08-28 IMAGING — MR MRI ELBOW LEFT WITHOUT IV CONTRAST
7 series · 40 of 40 positions shown · non-contrast
Comparison: 09/20/2020

PT was lifting and felt pop. TH
FINAL REPORT:
MRI ELBOW LEFT WITHOUT IV CONTRAST
INDICATION: Pain, lifting injury.
TECHNIQUE: Multiplanar multisequence MR imaging of the left elbow was performed without contrast.

[Series 4001: survey · axial · left · 6.0mm · 0.47mm/px · z∈[-7,+122]mm · 2 of 9 slices shown]
[im 1/9]
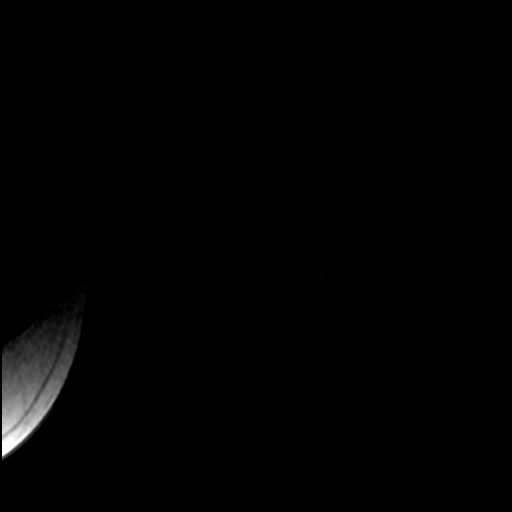
[im 9/9]
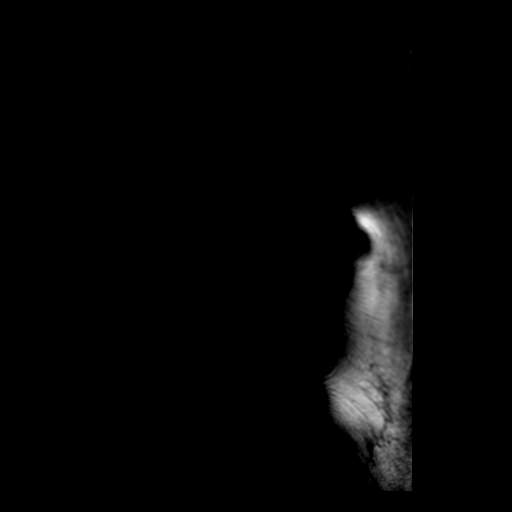

[Series 5001: T1 · axial · left · 3.0mm · 0.50mm/px · z∈[-43,+68]mm · 5 of 32 slices shown (1 of 2)]
[im 1/32]
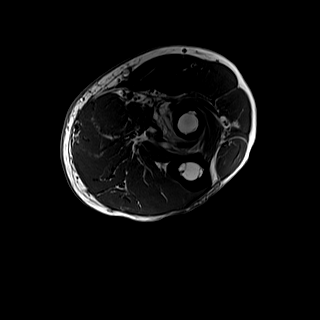
[im 8/32]
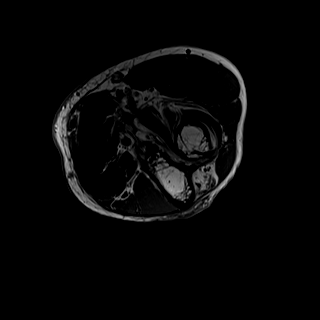
[im 16/32]
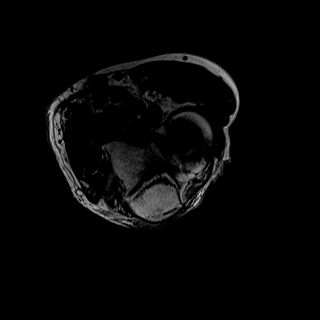
[im 24/32]
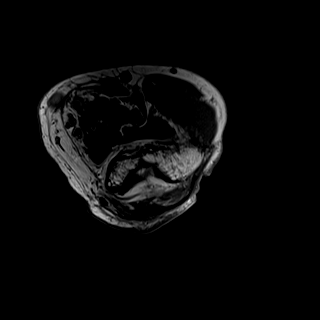
[im 32/32]
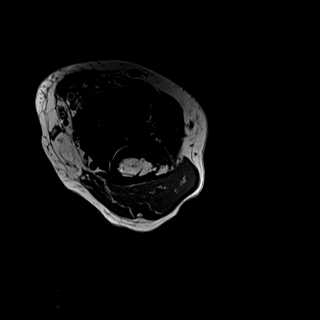

[Series 6001: PD fat-sat · axial · left · 3.0mm · 0.50mm/px · z∈[-43,+68]mm · 5 of 32 slices shown (1 of 2)]
[im 1/32]
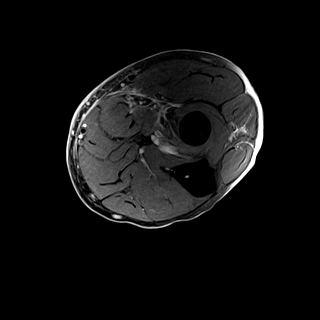
[im 8/32]
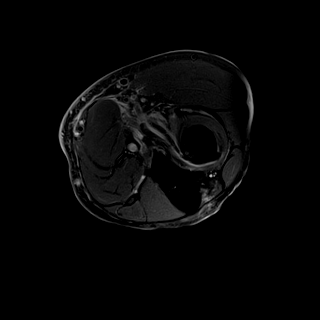
[im 16/32]
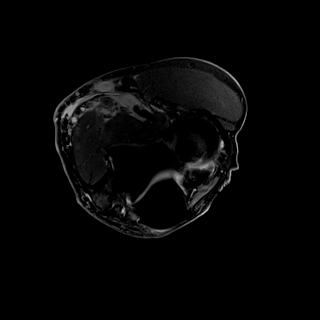
[im 24/32]
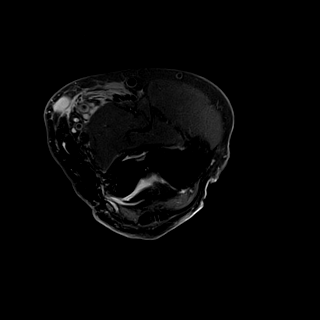
[im 32/32]
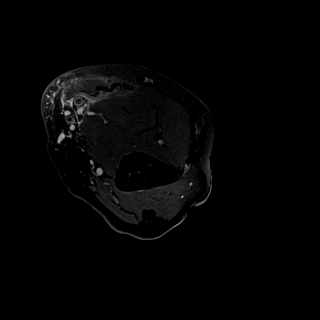

[Series 8001: T1 · coronal · left · 3.0mm · 0.58mm/px · 5 of 30 slices shown (2 of 2)]
[im 1/30]
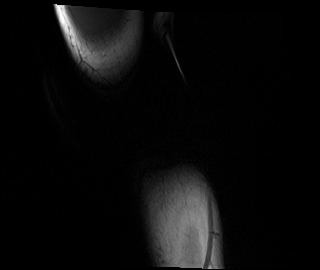
[im 8/30]
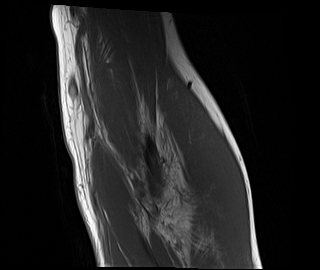
[im 15/30]
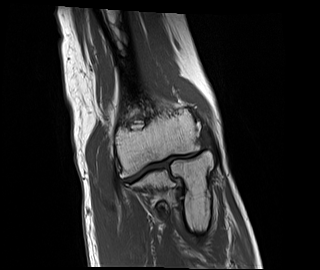
[im 22/30]
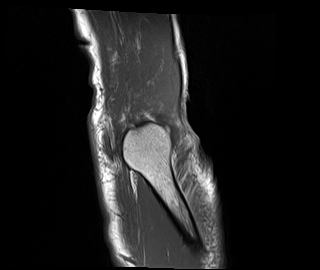
[im 30/30]
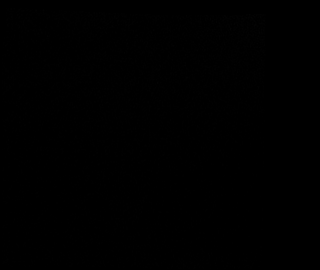

[T2 fat-sat · coronal · left · 3.0mm · 0.58mm/px · 5 of 30 slices shown]
[im 1/30]
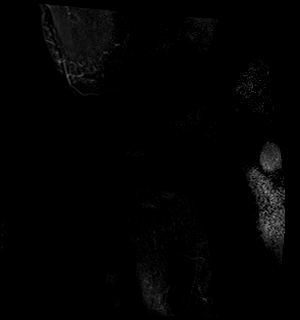
[im 8/30]
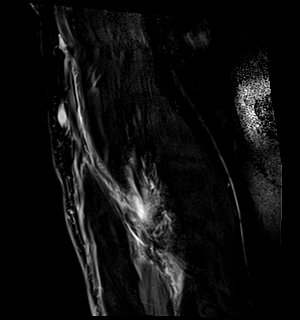
[im 15/30]
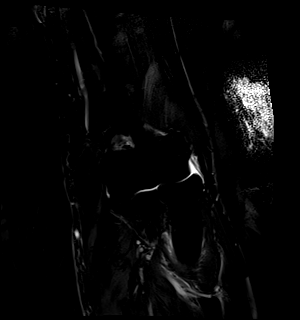
[im 22/30]
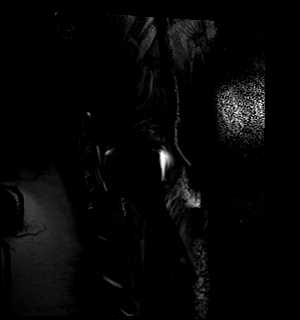
[im 30/30]
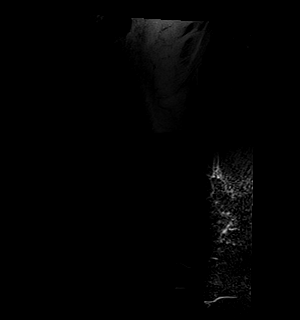

[PD fat-sat · sagittal · left · 3.5mm · 0.52mm/px · 5 of 32 slices shown (2 of 2)]
[im 1/32]
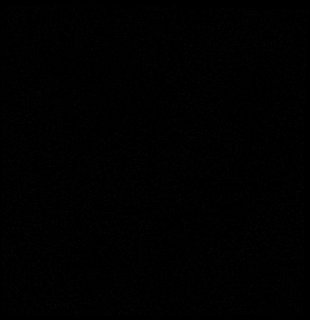
[im 8/32]
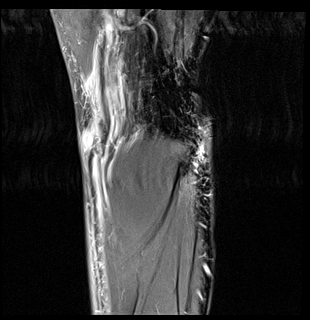
[im 16/32]
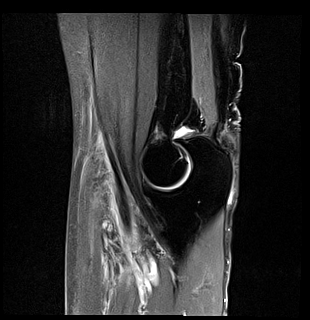
[im 24/32]
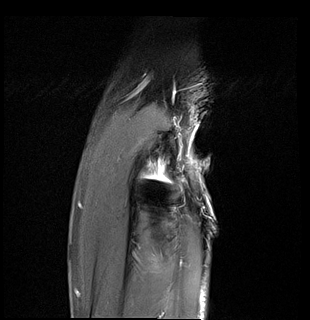
[im 32/32]
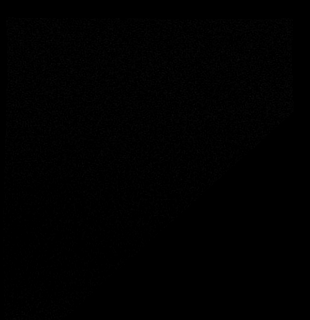

[(person_name) · coronal · left · 1.2mm · 0.70mm/px · 13 of 80 slices shown]
[im 1/80]
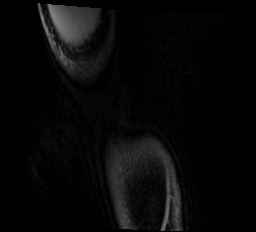
[im 7/80]
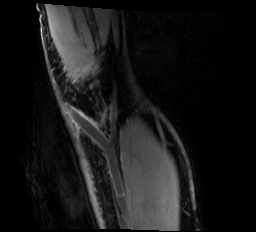
[im 14/80]
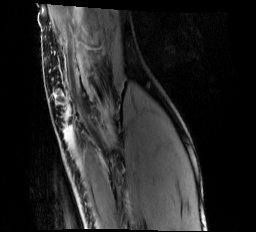
[im 20/80]
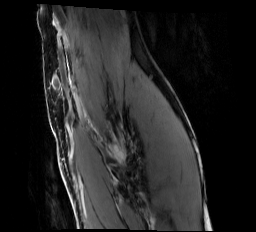
[im 27/80]
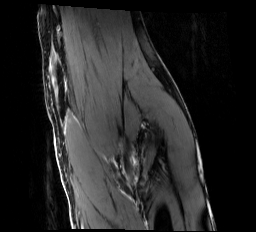
[im 33/80]
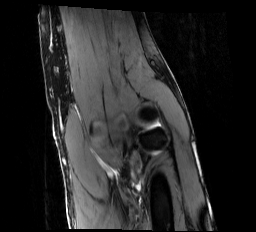
[im 40/80]
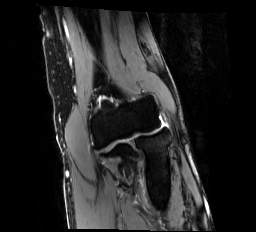
[im 47/80]
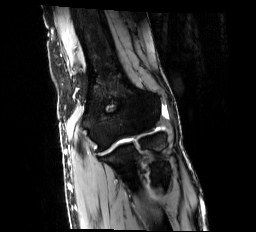
[im 53/80]
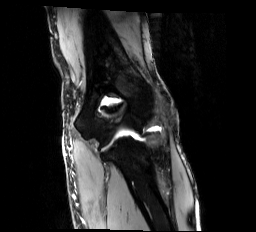
[im 60/80]
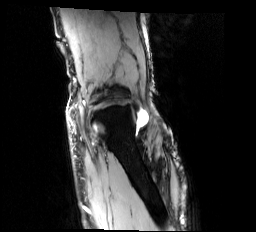
[im 66/80]
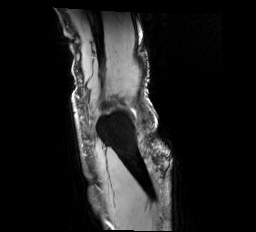
[im 73/80]
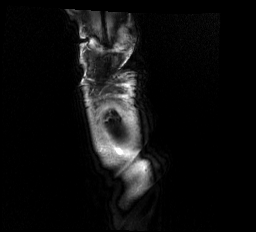
[im 80/80]
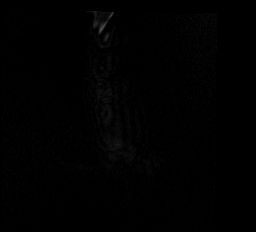

[40 of 40 positions shown; findings below may reference images not displayed]

FINDINGS: There is no evidence of a recent stress or traumatic fracture. Mild elbow osteoarthrosis. There is a physiologic amount of joint fluid with no large intra-articular bodies.
The ulnar collateral, lateral ulnar collateral, radial collateral, and annular ligaments are intact. The triceps tendon is intact. There is tendinosis with moderate grade partial tearing of the common extensor tendon at its humeral attachment
The distal biceps tendon is torn from its radial attachment and retracted by approximately 2 cm. There is background biceps tendinosis. Prominent fluid in the bicipitoradial bursa with surrounding soft tissue edema. The brachialis tendon is intact.
There is no abnormal signal in the ulnar nerve. No muscle atrophy.
IMPRESSION: 
IMPRESSION: 1. The distal biceps tendon is torn from its radial attachment with associated bicipitoradial bursitis and soft tissue edema.
2. Tendinosis and partial tearing of the common extensor tendon.

## 2022-11-21 IMAGING — CR XR CHEST 1 VIEW
1 series · 1 of 1 positions shown · non-contrast
Comparison: 06/01/2019

FINAL REPORT:
XR CHEST 1 VIEW
INDICATION: Chest pain.
TECHNIQUE: AP view of the chest.

[AP]
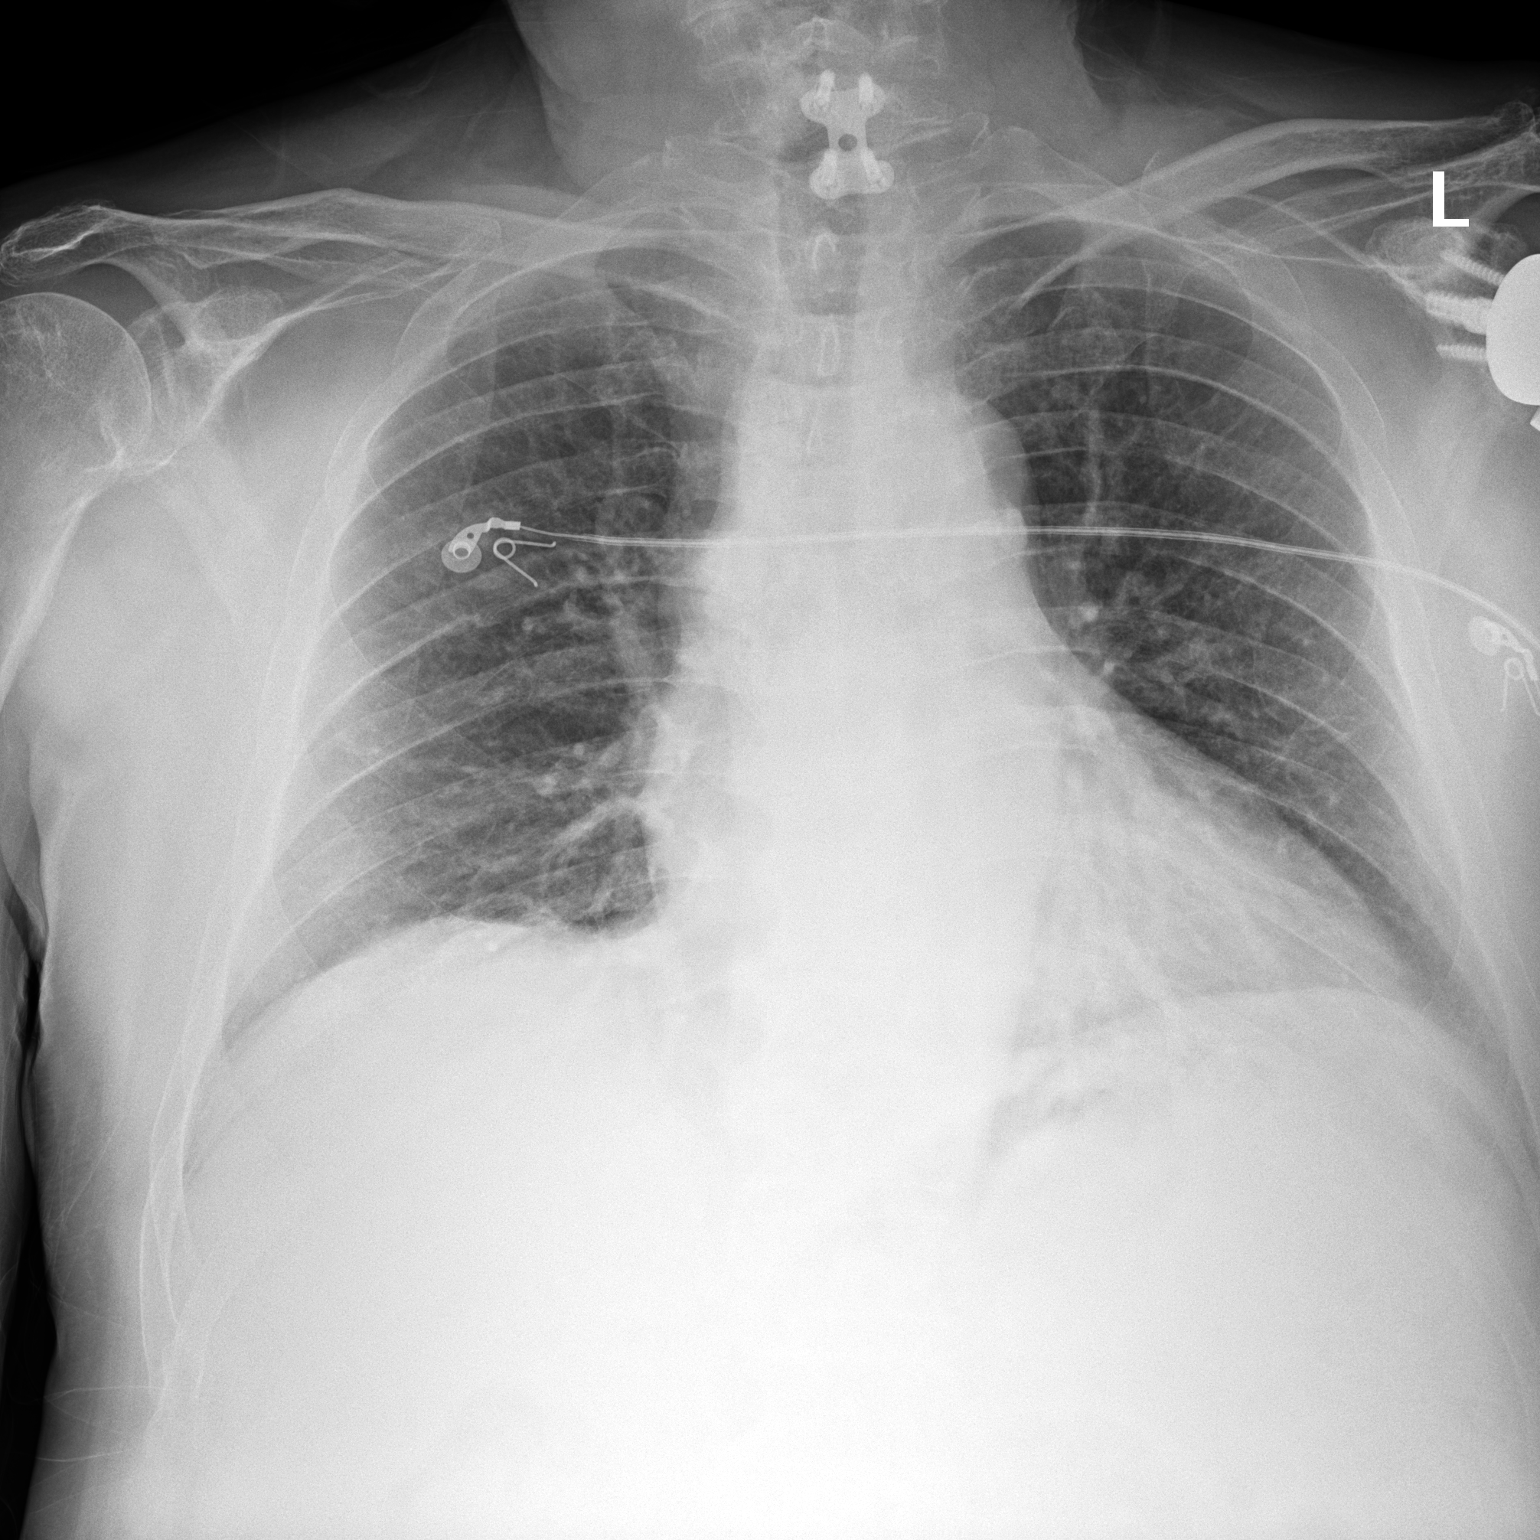

[1 of 1 positions shown; findings below may reference images not displayed]

FINDINGS: The cardiac silhouette is within normal limits. The mediastinum is in the midline. No consolidation or pulmonary edema. The costophrenic angles are sharp. No pneumothorax. Reverse left shoulder arthroplasty. ACDF in the lower cervical spine.
IMPRESSION: 
IMPRESSION: No radiographic evidence of an acute cardiopulmonary process.

## 2023-11-04 IMAGING — CT CT SHOULDER RIGHT WITHOUT IV CONTRAST
1 of 5 series · 2 of 14 positions shown, 3 images · non-contrast
Comparison: None available

Hx of shoulder surgery x3
FINAL REPORT:
HISTORY: Shoulder surgery and osteoarthritis
Procedure: CT of the right shoulder without contrast
TECHNIQUE: Unenhanced images were reconstructed through the right shoulder.

[Series 2: shoulder .625 · axial · 0.59mm/px · z∈[-48,+20]mm · 2 of 328 slices shown, 3 images]
[im 110/328  soft-tissue]
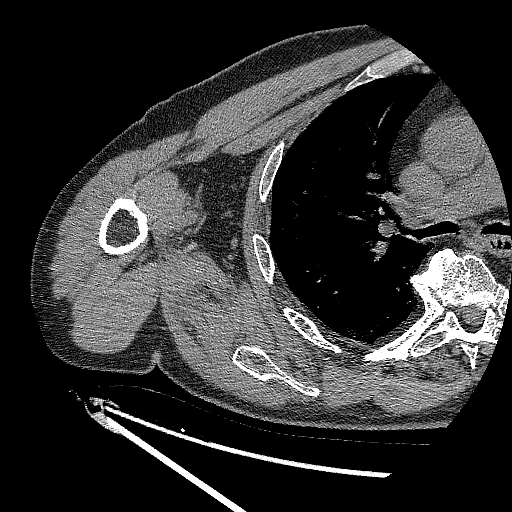
[im 110/328  bone]
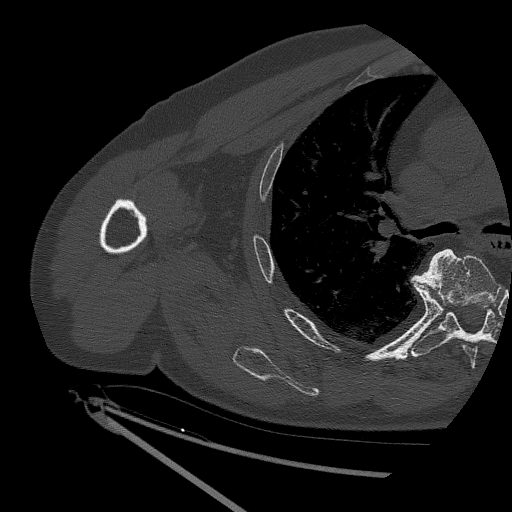
[im 219/328  bone]
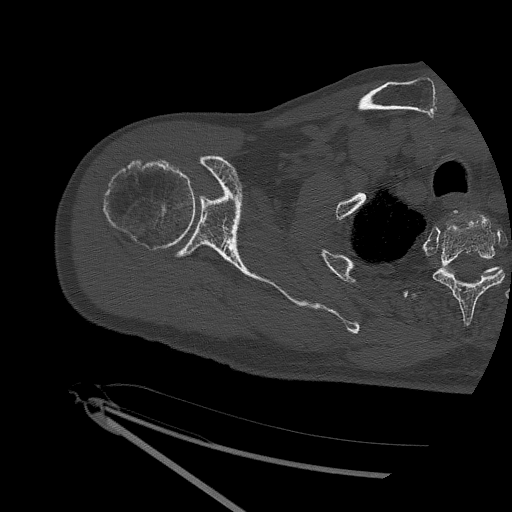

[2 of 14 positions shown; findings below may reference images not displayed]

FINDINGS: No aggressive marrow signal abnormality. No acute fracture. No destructive bone lesion. Moderate degenerative changes in the shoulder. Prior postsurgical change. Visible portions of the lung are unremarkable. Soft tissues are unremarkable.
IMPRESSION: Degenerative and postsurgical changes in the right shoulder with no acute findings.
All CT scans at this facility use dose modulation and/or weight based dosing when appropriate to reduce radiation dose to as low as reasonably achievable.
